# Patient Record
Sex: Female | Born: 1980 | Race: White | Hispanic: No | Marital: Married | Smoking: Never smoker
Health system: Southern US, Community
[De-identification: ages and names within clinical notes are randomized; demographics above are authoritative.]

## PROBLEM LIST (undated history)

## (undated) DIAGNOSIS — M25569 Pain in unspecified knee: Secondary | ICD-10-CM

## (undated) DIAGNOSIS — M549 Dorsalgia, unspecified: Secondary | ICD-10-CM

## (undated) DIAGNOSIS — F32A Depression, unspecified: Secondary | ICD-10-CM

## (undated) DIAGNOSIS — K5900 Constipation, unspecified: Secondary | ICD-10-CM

## (undated) DIAGNOSIS — N809 Endometriosis, unspecified: Secondary | ICD-10-CM

## (undated) DIAGNOSIS — O24419 Gestational diabetes mellitus in pregnancy, unspecified control: Secondary | ICD-10-CM

## (undated) DIAGNOSIS — E559 Vitamin D deficiency, unspecified: Secondary | ICD-10-CM

## (undated) DIAGNOSIS — K219 Gastro-esophageal reflux disease without esophagitis: Secondary | ICD-10-CM

## (undated) DIAGNOSIS — R5383 Other fatigue: Secondary | ICD-10-CM

## (undated) DIAGNOSIS — J302 Other seasonal allergic rhinitis: Secondary | ICD-10-CM

## (undated) DIAGNOSIS — R600 Localized edema: Secondary | ICD-10-CM

## (undated) DIAGNOSIS — E538 Deficiency of other specified B group vitamins: Secondary | ICD-10-CM

## (undated) DIAGNOSIS — F419 Anxiety disorder, unspecified: Secondary | ICD-10-CM

## (undated) DIAGNOSIS — M2550 Pain in unspecified joint: Secondary | ICD-10-CM

## (undated) HISTORY — DX: Depression, unspecified: F32.A

## (undated) HISTORY — DX: Vitamin D deficiency, unspecified: E55.9

## (undated) HISTORY — DX: Endometriosis, unspecified: N80.9

## (undated) HISTORY — DX: Dorsalgia, unspecified: M54.9

## (undated) HISTORY — PX: LAPAROTOMY: SHX154

## (undated) HISTORY — PX: KNEE SURGERY: SHX244

## (undated) HISTORY — DX: Pain in unspecified joint: M25.50

## (undated) HISTORY — DX: Anxiety disorder, unspecified: F41.9

## (undated) HISTORY — DX: Pain in unspecified knee: M25.569

## (undated) HISTORY — DX: Localized edema: R60.0

## (undated) HISTORY — DX: Other fatigue: R53.83

## (undated) HISTORY — DX: Gastro-esophageal reflux disease without esophagitis: K21.9

## (undated) HISTORY — DX: Deficiency of other specified B group vitamins: E53.8

## (undated) HISTORY — PX: WISDOM TOOTH EXTRACTION: SHX21

## (undated) HISTORY — DX: Constipation, unspecified: K59.00

## (undated) HISTORY — DX: Gestational diabetes mellitus in pregnancy, unspecified control: O24.419

---

## 1999-05-08 ENCOUNTER — Encounter: Payer: Self-pay | Admitting: Emergency Medicine

## 1999-05-08 ENCOUNTER — Emergency Department (HOSPITAL_COMMUNITY): Admission: EM | Admit: 1999-05-08 | Discharge: 1999-05-08 | Payer: Self-pay | Admitting: Emergency Medicine

## 2003-10-06 ENCOUNTER — Encounter: Admission: RE | Admit: 2003-10-06 | Discharge: 2003-10-06 | Payer: Self-pay | Admitting: Emergency Medicine

## 2003-11-30 ENCOUNTER — Other Ambulatory Visit: Admission: RE | Admit: 2003-11-30 | Discharge: 2003-11-30 | Payer: Self-pay | Admitting: Obstetrics and Gynecology

## 2004-11-05 ENCOUNTER — Encounter: Admission: RE | Admit: 2004-11-05 | Discharge: 2004-11-05 | Payer: Self-pay | Admitting: Obstetrics and Gynecology

## 2004-11-06 ENCOUNTER — Ambulatory Visit (HOSPITAL_COMMUNITY): Admission: RE | Admit: 2004-11-06 | Discharge: 2004-11-06 | Payer: Self-pay | Admitting: Gastroenterology

## 2004-11-06 ENCOUNTER — Encounter (INDEPENDENT_AMBULATORY_CARE_PROVIDER_SITE_OTHER): Payer: Self-pay | Admitting: Specialist

## 2006-08-07 ENCOUNTER — Encounter (INDEPENDENT_AMBULATORY_CARE_PROVIDER_SITE_OTHER): Payer: Self-pay | Admitting: Specialist

## 2006-08-07 ENCOUNTER — Ambulatory Visit (HOSPITAL_COMMUNITY): Admission: RE | Admit: 2006-08-07 | Discharge: 2006-08-07 | Payer: Self-pay | Admitting: Obstetrics and Gynecology

## 2008-07-22 ENCOUNTER — Inpatient Hospital Stay (HOSPITAL_COMMUNITY): Admission: AD | Admit: 2008-07-22 | Discharge: 2008-07-25 | Payer: Self-pay | Admitting: Obstetrics and Gynecology

## 2008-07-22 ENCOUNTER — Encounter (INDEPENDENT_AMBULATORY_CARE_PROVIDER_SITE_OTHER): Payer: Self-pay | Admitting: Obstetrics and Gynecology

## 2009-03-25 ENCOUNTER — Inpatient Hospital Stay (HOSPITAL_COMMUNITY): Admission: AD | Admit: 2009-03-25 | Discharge: 2009-03-27 | Payer: Self-pay | Admitting: Obstetrics and Gynecology

## 2010-08-22 ENCOUNTER — Emergency Department (HOSPITAL_COMMUNITY)
Admission: EM | Admit: 2010-08-22 | Discharge: 2010-08-22 | Payer: Self-pay | Source: Home / Self Care | Admitting: Family Medicine

## 2010-11-30 ENCOUNTER — Encounter: Payer: Self-pay | Admitting: Family Medicine

## 2011-02-18 LAB — COMPREHENSIVE METABOLIC PANEL
AST: 28 U/L (ref 0–37)
Albumin: 2.5 g/dL — ABNORMAL LOW (ref 3.5–5.2)
Alkaline Phosphatase: 125 U/L — ABNORMAL HIGH (ref 39–117)
Chloride: 108 mEq/L (ref 96–112)
GFR calc Af Amer: >60 mL/min (ref >60)
Potassium: 3.7 mEq/L (ref 3.5–5.1)
Sodium: 136 mEq/L (ref 135–145)
Total Bilirubin: 0.4 mg/dL (ref 0.3–1.2)
Total Protein: 5.9 g/dL — ABNORMAL LOW (ref 6.0–8.3)

## 2011-02-18 LAB — RPR: RPR Ser Ql: NONREACTIVE

## 2011-02-18 LAB — URINALYSIS, ROUTINE W REFLEX MICROSCOPIC
Bilirubin Urine: NEGATIVE
Nitrite: NEGATIVE
Specific Gravity, Urine: >1.03 — ABNORMAL HIGH (ref 1.005–1.030)
Urobilinogen, UA: 0.2 mg/dL (ref 0.0–1.0)
pH: 6 (ref 5.0–8.0)

## 2011-02-18 LAB — URINE MICROSCOPIC-ADD ON

## 2011-02-18 LAB — CBC
MCHC: 35.1 g/dL (ref 30.0–36.0)
MCV: 97.1 fL (ref 78.0–100.0)
Platelets: 167 10*3/uL (ref 150–400)
Platelets: 210 10*3/uL (ref 150–400)
RBC: 3.37 MIL/uL — ABNORMAL LOW (ref 3.87–5.11)
RDW: 12.7 % (ref 11.5–15.5)
WBC: 9.4 10*3/uL (ref 4.0–10.5)

## 2011-03-25 NOTE — Discharge Summary (Signed)
Karen Norman, UMEDA NO.:  [id number]   MEDICAL RECORD NO.:  [id number]          PATIENT TYPE:  INP   LOCATION:  9304                          FACILITY:  [facility]   PHYSICIAN:  Malachi Pro. Ambrose Mantle, M.D. DATE OF BIRTH:  September 05, 1981   DATE OF ADMISSION:  07/22/2008  DATE OF DISCHARGE:  07/25/2008                               DISCHARGE SUMMARY   A 30 year old white female at approximately 5 plus weeks' gestation by  last period of June 14, 2008, with a positive pregnancy test 2 weeks  prior to admission came to the hospital with sudden onset of abdominal  pain.  Pain was sharp.  No cramping.  No vaginal bleeding.  The patient  had had an upper respiratory infection with low-grade fever.  She had  been placed on amoxicillin prior to admission.  She had no nausea and  vomiting.  Bowel movements were normal.  She had been started on the  amoxicillin in the Urgent Care.   PAST MEDICAL HISTORY:  Negative.   OB HISTORY:  Negative.   GYN HISTORY:  No history of abnormal Paps.  Two years ago, the patient  had a diagnostic laparoscopy for endometriosis.  She had also had a knee  arthroscopy.   ALLERGIES:  She had no allergies.   MEDICATIONS:  Amoxicillin and prenatal vitamins.   PHYSICAL EXAMINATION:  VITAL SIGNS:  Temperature had been 99 at home.  Blood pressure was 120/70.  HEART/LUNGS:  Normal.  ABDOMEN:  Soft and tender diffusely with mild rebound.  PELVIC:  Deferred secondary to discomfort.   Ultrasound showed an intrauterine pregnancy with the yolk sac.  Small  fetal pole, too small for heart motion.  There was a 6 x 10 cm complex  cystic mass in the left adnexa.  Dr. Senaida Ores admitted the patient for  pain management and serial blood counts, operative management if no  resolution of the pain or drop in the hematocrit.  Later on the day, she  had more left lower quadrant pain.  She had not been out of bed.  She  had taken Dilaudid.  Dr. Senaida Ores discussed  options with the patient  and she decided to proceed to the OR because of the significant pain.  The patient underwent a minilaparotomy by Dr. Senaida Ores because of the  severe abdominal pain, left pelvic mass, and suspected hemoperitoneum,  and she found a ruptured left ovary with approximately 500 mL  hemoperitoneum with continued bleeding.  She underwent a left salpingo-  oophorectomy.  Postoperatively, she did very well.  She did have a  temperature elevation to 102.4 degrees.  There was no obvious site of  the fever.  She defervesced on Unasyn and has remained afebrile for 30  hours prior to discharge.   LABORATORY DATA:  Initial hemoglobin 13.2, hematocrit 39.4, white count  62,100, and platelet count 202,000.  Followup hemoglobins on November 8,  10.4 and 9.4.  Metabolic profile showed a potassium of 3.4, glucose of  128, AST of 38, and ALT of 29.  HCG quantitative was 15, 900.  Ultrasound showed intrauterine gestational sac containing  an embryo.  Crown-rump length was 2 mm.  Cardiac activity could not be visualized.  There was a 10-cm complex mass in the left adnexa.  Differential  diagnosis was inflammatory processes such as tubo ovarian abscess,  cystic neoplasm of the ovary or heterotopic pregnancy.  Path report is  pending.   FINAL DIAGNOSES:  1. Ruptured left ovarian cyst with hemoperitoneum.  2. Intrauterine pregnancy, 5 plus weeks.   OPERATION:  1. Minilaparotomy.  2. Left salpingo-oophorectomy.   FINAL CONDITION:  Improved.   INSTRUCTIONS:  No vaginal entrance, no heavy lifting or strenuous  activity.  Call with any fever above 100.4 degrees.  Call with any  problems.  The patient is discharged on Prometrium or progesterone  vaginally and Percocet for pain.  She is to continue her amoxicillin and  return to the office in 2 days to have her staples removed.      Malachi Pro. Ambrose Mantle, M.D.  Electronically Signed     TFH/MEDQ  D:  07/25/2008  T:  07/25/2008   Job:  [id number]

## 2011-03-25 NOTE — Discharge Summary (Signed)
NAMEMAI, LONGNECKER NO.:  [id number]   MEDICAL RECORD NO.:  [id number]          PATIENT TYPE:  INP   LOCATION:  9131                          [facility]   PHYSICIAN:  Malachi Pro. Ambrose Mantle, M.D. DATE OF BIRTH:  07/23/1981   DATE OF ADMISSION:  03/25/2009  DATE OF DISCHARGE:  03/27/2009                               DISCHARGE SUMMARY   A 30 year old white married female para 0, gravida 1, EDC Mar 20, 2009,  admitted in early labor.  The patient felt a pop lying in bed, did not  see any flow of fluid, but some little bit of bleeding.  Blood group and  type O+, negative antibody, nonreactive serology, rubella immune,  hepatitis B surface antigen negative, HIV negative, GC and chlamydia  negative, first trimester screen negative, 1-hour Glucola 126, group B  strep negative.  Ultrasound on July 31, 2008, crown-rump length  6.35 mm, 6 weeks 3 days, [facility] Mar 23, 2009.  The patient had a left  salpingo-oophorectomy for hemorrhagic corpus luteum cyst with  hemoperitoneum on July 22, 2008.  Ultrasound on August 05, 2008,  crown-rump length was 1.6 cm, [facility] Mar 19, 2009.  Ultrasound on September 07, 2008, crown-rump length 4.8 cm, 6.48 cm, 12 weeks 6 days, [facility] Mar 16, 2009.  Ultrasound on October 19, 2008, average gestational age, 13  weeks 0 days, [facility] Mar 15, 2009.  The patient received Dilaudid until  September 11, 2008.  Prenatal care otherwise uncomplicated.   PAST MEDICAL HISTORY:   ALLERGIES:  No known allergies.   OPERATIONS:  The patient had a diagnostic laparoscopy for  endometriosis.  She had a left salpingo-oophorectomy.  She had had  arthroscopic surgery and wisdom teeth extracted   ILLNESSES:  Endometriosis.   SOCIAL HISTORY:  Alcohol, tobacco, and drugs none.   FAMILY HISTORY:  Father with an MI and high blood pressure.  Paternal  grandmother and maternal grandmother with breast cancer.  Father with  bipolar.   PHYSICAL EXAMINATION:  VITAL  SIGNS:  On admission, the patient's blood  pressures were elevated.  Serial blood pressures revealed blood  pressures to be between 137/91 and 160/96.  HEART/LUNGS:  Normal.  ABDOMEN:  Soft.  PELVIC:  Term size fundus.  Cervix was 3 cm, 90%, vertex at a -2.   LABORATORY FINDINGS:  PIH labs were done showed no evidence of  preeclampsia.  Ultrasound showed no evidence of abruption.  The patient  received an epidural.  By 1:35 p.m., the cervix was 10 cm, vertex at a 0  station.  Her contractions were every 2-3 minutes.  There were some  variable decelerations with contractions.  There was more vaginal  bleeding than would expect.  By 2:43 p.m., the cervix was 10 cm, vertex  ROA.  There was a small amount of caput visible with contractions.  The  patient pushed over 2 hours and asked for assistance.  A Kiwi self-  controlled vacuum was placed with approximately 6 cm of caput visible  with pushing and was 1 contraction and no pop offs, a living female  infant,  7 pounds 6 ounces was delivered, ROA over a second-degree right  of midline laceration with extension to the right sulcus.  A left labial  laceration with extension into the vagina was also present.  Placenta  was intact.  The uterus was normal.  Rectal was negative.  Lacerations  were repaired with 2-0 and 3-0 Vicryl with 1% Xylocaine local block.  Blood loss was estimated at 500 mL.  Apgars of the infant were 9 at 1  and 9 at 5 minutes.  Postpartum, the patient did well and was discharged  on the second postpartum day.  RPR was nonreactive.   INITIAL LABORATORY WORK:  SGOT and PT were 28 and 17 respectively.  Bilirubin was 0.4, creatinine was 0.68.  LDH 128, uric acid 6.4.  Cath  urine showed no protein.  Initial hemoglobin 13.7, hematocrit 39.2,  white count 94,100, platelet count 210,000.  Followup hemoglobin 11.5,  hematocrit 32.7, white count 10,400, and platelet counts 167,000.   FINAL DIAGNOSES:  Intrauterine pregnancy at 40+  weeks, pregnancy-induced  high blood pressure.   OPERATION:  Vacuum-assisted vaginal delivery ROA, repair of right of  midline second-degree laceration into the right sulcus and left labial  laceration.   FINAL CONDITION:  Improved.   INSTRUCTIONS:  Our regular discharge instruction booklet.  Motrin 600 mg  30 tablets 1 every 6 hours as needed for pain.  Percocet 5/325, 30  tablets 1 every 4-6 hours as needed for pain.  The patient is advised to  return to the office in 6 weeks for followup examination.      Malachi Pro. Ambrose Mantle, M.D.  Electronically Signed     TFH/MEDQ  D:  03/27/2009  T:  03/27/2009  Job:  [id number]

## 2011-03-25 NOTE — Op Note (Signed)
Karen Norman, KLUTTS NO.:  [id number]   MEDICAL RECORD NO.:  [id number]          PATIENT TYPE:  INP   LOCATION:  9304                          [facility]   PHYSICIAN:  Huel Cote, M.D. DATE OF BIRTH:  [birth year]   DATE OF PROCEDURE:  07/22/2008  DATE OF DISCHARGE:  07/25/2008                               OPERATIVE REPORT   PREOPERATIVE DIAGNOSES:  1. Severe abdominal pain with acute abdomen.  2. Left pelvic mass.  3. Suspected hemoperitoneum.   POSTOPERATIVE DIAGNOSIS:  Ruptured left ovary with 500 plus mL's of  hemoperitoneum and continued bleeding.   PROCEDURE:  Minilaparotomy and left salpingo-oophorectomy   SURGEON:  Huel Cote, MD   ANESTHESIA:  Spinal.   FINDINGS:  There is a left ovary which was greater than 8 cm in diameter  with a rupture in the capsule, which spanned the entire length of the  ovary.  There was approximately 500 mL of hemoperitoneum with additional  clot inside the ovary itself.  Uterus and right ovary and tube were  completely normal.   SPECIMENS:  Left salpingo-oophorectomy, which was sent to Pathology and  this confirmed a benign ruptured ovarian cyst.   ESTIMATED BLOOD LOSS:  600 mL total.   IV FLUIDS:  2000 mL clear urine.   URINE OUTPUT:  300 mL clear urine.   PROCEDURE IN DETAIL:  After a careful counseling, the patient was taken  to the operating room and spinal anesthesia was obtained without  difficulty.  She was then prepped and draped in the normal sterile  fashion in the dorsal supine position.  A small Pfannenstiel skin  incision was then made over the symphysis pubis and carried through the  underlying layer of fascia by sharp dissection and Bovie cautery.  The  fascia was then opened in the midline and extended laterally with Mayo  scissors.  The inferior aspect was grasped, elevated, and dissected off  the rectus muscles.  Superior aspect was likewise dissected off the  rectus muscles.   These were separated in the midline, and the peritoneal  cavity entered bluntly.  Hemoperitoneum was immediately encountered and  suctioned, and thought to be approximately 500 mL.  The left ovary was  identified and lifted up through the incision and was found to be  grossly enlarged with a split down the entire length of the ovarian  capsule with clot in the inner part of the ovary, as well as continued  bleeding from the capsule.  At this point given the gross distortion of  the ovary and continued bleeding, it was felt that the safest procedure  for the ongoing intrauterine pregnancy was a quick removal of the left  tube and ovary versus a prolonged surgery with trying to stop the  bleeding on the ovary.  Therefore, the patient had a left salpingo-  oophorectomy performed and the specimen was handed off to Pathology.  The remaining ovary and tube looked normal.  The uterus itself appears  slightly enlarged, but normal and therefore all hemoperitoneum and  bleeding were removed and appeared hemostatic.  The peritoneum was then  closed with 0 Vicryl in a running fashion, the fascia was closed with 0  Vicryl in a running fashion, and the skin was closed with staples.  The  patient will be placed on Prometrium and Delalutin IM to support the  pregnancy with the removal of a corpus luteum cyst and a careful  discussion was had with her and her family about the possible impact to  the pregnancy.  They understand that there is really no alternative  given the significant bleeding intra-abdominally and that we will do  everything possible to continue to support the ongoing intrauterine  pregnancy      Huel Cote, M.D.  Electronically Signed     KR/MEDQ  D:  08/14/2008  T:  08/15/2008  Job:  [id number]

## 2011-03-28 NOTE — Op Note (Signed)
Karen Norman, Karen Norman                 ACCOUNT NO.:  [id number]   MEDICAL RECORD NO.:  [id number]          PATIENT TYPE:  AMB   LOCATION:  ENDO                         FACILITY:  [facility]   PHYSICIAN:  Petra Kuba, M.D.    DATE OF BIRTH:  [birth year]   DATE OF PROCEDURE:  11/06/2004  DATE OF DISCHARGE:                                 OPERATIVE REPORT   PROCEDURE PERFORMED:  Esophagogastroduodenoscopy with biopsy.   ENDOSCOPIST:  Petra Kuba, M.D.   INDICATIONS FOR PROCEDURE:  Upper tract symptoms, reflux, iron deficiency.  Consent was signed after the risks, benefits, methods and options were  thoroughly discussed in the office.   MEDICINES USED:  Demerol 50 mg, Versed 7.5 mg.   DESCRIPTION OF PROCEDURE:  The video endoscope was inserted by direct  vision.  No obvious hiatal hernia was seen.  Possibly a little loosening of  the gastroesophageal junction was noted.  Scope passed into the stomach and  advanced through the normal antrum normal pylorus into a normal duodenal  bulb and around the C-loop to a normal second portion of the duodenum.  A  few distal duodenal biopsies were obtained to rule out malabsorption.  The  scope was withdrawn back to the stomach and a good look at the bulb did not  reveal any additional findings.  The stomach was evaluated on retroflex and  straight visualization with a good look at the cardia, fundus, angularis,  lesser and greater curve.  No findings were seen.  Air was suctioned, scope  was slowly withdrawn.  Again, a good look at the esophagus was normal.  Scope was removed.  The patient tolerated the procedure well.  There were no  obvious immediate complication.   ENDOSCOPIC DIAGNOSIS:  Essentially normal esophagogastroduodenoscopy status  post duodenal biopsies to rule out malabsorption.   PLAN:  Await CBC, iron level and guaiacs to decide any other work-up and  plan.  Consider an upper abdominal ultrasound next to rule out gallstones if  symptoms continue.  Otherwise, will try different pump inhibitors secondary  to what's approved by her insurance and follow her up in the office in a few  months.  Will talk on the phone in a week about the biopsies.       MEM/MEDQ  D:  11/06/2004  T:  11/06/2004  Job:  [id number]   cc:   Onalee Hua L. Reed Breech, M.D.  [facility]  [address]  [place], [place] [address]  Fax: [phone]

## 2011-03-28 NOTE — Op Note (Signed)
NAME:  Karen Norman, GAMMON NO.:  [id number]   MEDICAL RECORD NO.:  [id number]          PATIENT TYPE:  AMB   LOCATION:  SDC                           [facility]   PHYSICIAN:  Richardean Sale, M.D.   DATE OF BIRTH:  1981/06/11   DATE OF PROCEDURE:  08/07/2006  DATE OF DISCHARGE:                                 OPERATIVE REPORT   PREOPERATIVE DIAGNOSES:  1. Pelvic pain.  2. Dyspareunia.   POSTOPERATIVE DIAGNOSES:  1. Pelvic pain.  2. Dyspareunia.  3. Endometriosis.   PROCEDURE:  Laparoscopy with fulguration of endometriosis.   SURGEON:  Richardean Sale, M.D.   ASSISTANT:  None.   COMPLICATIONS:  None.   ANESTHESIA:  General.   ESTIMATED BLOOD LOSS:  Minimal.   OPERATIVE FINDINGS:  Normal-appearing left Fallopian tube and ovary, normal-  appearing right ovary, two small (less than 0.5 cm) paratubal cysts on the  right with a bluish hue consistent with endometriosis, normal-appearing  uterus, normal anterior cul-de-sac and posterior cul-de-sac, one  endometriosis implant near the left uterosacral ligament, and a peritoneal  window beneath the left uterosacral ligament.   SPECIMENS:  Right tubal cyst to pathology.   INDICATIONS:  This is a 30 year old, gravida 3, white female who has a  history progressively worsening pelvic pain, dysmenorrhea, and dyspareunia  despite oral contraceptive pills.  The patient presents today for diagnostic  laparoscopy to evaluate for possible endometriosis.  The patient has no  symptoms suggestive of any gastrointestinal or urinary etiology as a source  of her pain.  She has been counseled on the risks, benefits, and  alternatives to the procedure including but not limited to hemorrhage  regarding transfusion, infection, injury to the bowel, the bladder, the  ureters, or other organs which could require additional surgery at the time  of this procedure or in the future, the possibility of anesthetic-related  complications,  venous thromboembolism, as well as the possibility of normal  anatomical findings at the time of the procedure.  The patient is also aware  that this procedure may not improve her pain.  The patient voices  understanding of all of the above and desires to proceed.  Informed consent  is then obtained and all questions answered before going to the OR.   DESCRIPTION OF PROCEDURE:  The patient was taken to the operating room where  she was given a general anesthetic.  She was then prepped and draped in the  usual sterile fashion and placed in the dorsal lithotomy position.  A red  rubber catheter was then used to drain the bladder.  Bimanual exam confirmed  the presence of a small midline mobile uterus with no obvious adnexal masses  and no posterior cul-de-sac nodularity.  A speculum was then placed in the  vagina, and the cervix was then grasped with a single-toothed tenaculum.  The acorn cannula was then introduced and the speculum was removed.  Attention was then turned to the patient's abdomen where 0.5% plain Marcaine  were injected into the infraumbilical area and a 10 mm skin incision was  made with the scalpel.  This was carried down sharply to the fascia.  The  fascia was then grasped with two Kocher clamps, elevated, and entered  sharply with the Mayo scissors.  The peritoneum was then identified, grasped  with a Kelly clamp, and entered sharply.  The fascial incision was secured  with a pursestring Vicryl suture and the Hassan trocar and sleeve were then  introduced.  Intraabdominal placement was confirmed with placement of the  laparoscope.  CO2 gas was then allowed to insufflate.  Inspection of the  pelvis revealed the findings noted above as well as a normal-appearing  appendix, normal liver and gallbladder.  At this point, a second incision  was then made in the suprapubic area.  This was a 5 mm skin incision, again  first by using Marcaine 3 cubic centimeters prior to making  the incision.  Through this incision, a 5 mm port was then introduced under direct  visualization.  A third port was placed in the right lower quadrant in a  similar fashion in an area that was well away from the inferior epigastric  vessels.  Using the Klepenger bipolar cautery, the endometrial implant along  the left uterosacral ligament were then cauterized using the Endoshears.  The right paratubal cyst was cauterized at its base and transected; this was  sent to pathology.  The second cyst on the right ovary was smaller.  In the  process of trying to remove this with the Endoshears, the cyst did rupture  with serous chocolate-appearing fluid inside.  The edges of the cyst were  then grasped and cauterized and the Fallopian tube was hemostatic.  These  cysts did not appear to affect the lumen or the fimbria of the Fallopian  tube.  At this point, the procedure was terminated.  Ten cubic centimeters  of 0.5% Marcaine were introduced into the abdomen with a syringe; this was  poured over the right Fallopian tube for additional postoperative pain  relief.  All instruments were then removed from the patient's abdomen under  direct visualization.  CO2 gas was allowed to escape.  The laparoscope and  Hassan trocar were then removed.  The fascial incision suture was tied with  adequate closure of the fascia.  The skin edges were then closed with 4-0  Vicryl in a subcuticular fashion.  The acorn cannula and single-toothed  tenaculum were removed from the vagina.  The patient tolerated the procedure  very well.  All sponge, lap, needle, and instrument counts were correct x 2.  She was taken to the recovery room awakened in stable condition, and there  were no complications.      Richardean Sale, M.D.  Electronically Signed     JW/MEDQ  D:  08/07/2006  T:  08/09/2006  Job:  [id number]

## 2011-03-31 ENCOUNTER — Inpatient Hospital Stay (HOSPITAL_COMMUNITY)
Admission: AD | Admit: 2011-03-31 | Discharge: 2011-04-03 | DRG: 372 | Disposition: A | Discharge status: Home / Self Care | Payer: BC Managed Care – PPO | Source: Ambulatory Visit | Attending: Obstetrics and Gynecology | Admitting: Obstetrics and Gynecology

## 2011-03-31 DIAGNOSIS — O2690 Pregnancy related conditions, unspecified, unspecified trimester: Secondary | ICD-10-CM

## 2011-04-01 ENCOUNTER — Inpatient Hospital Stay (HOSPITAL_COMMUNITY): Payer: BC Managed Care – PPO

## 2011-04-01 ENCOUNTER — Encounter (HOSPITAL_COMMUNITY): Payer: Self-pay | Admitting: Radiology

## 2011-04-01 ENCOUNTER — Other Ambulatory Visit: Payer: Self-pay | Admitting: Obstetrics and Gynecology

## 2011-04-01 LAB — MAGNESIUM: Magnesium: 8.8 mg/dL (ref 1.5–2.5)

## 2011-04-02 LAB — CBC
HCT: 34.6 % — ABNORMAL LOW (ref 36.0–46.0)
MCHC: 32.7 g/dL (ref 30.0–36.0)
Platelets: 240 10*3/uL (ref 150–400)
RDW: 13.7 % (ref 11.5–15.5)
WBC: 21.7 10*3/uL — ABNORMAL HIGH (ref 4.0–10.5)

## 2011-04-03 ENCOUNTER — Encounter (HOSPITAL_COMMUNITY)
Admission: RE | Admit: 2011-04-03 | Discharge: 2011-04-03 | Disposition: A | Discharge status: Home / Self Care | Payer: BC Managed Care – PPO | Source: Ambulatory Visit | Attending: Obstetrics and Gynecology | Admitting: Obstetrics and Gynecology

## 2011-04-03 DIAGNOSIS — O923 Agalactia: Secondary | ICD-10-CM | POA: Insufficient documentation

## 2011-04-07 NOTE — Discharge Summary (Signed)
NAME:  Karen Norman, FOLZ NO.:  [id number]  MEDICAL RECORD NO.:  [id number]           PATIENT TYPE:  I  LOCATION:  9315                          [facility]  [facility]  PHYSICIAN:  Zenaida Niece, M.D.DATE OF BIRTH:  08-20-1981  DATE OF ADMISSION:  03/31/2011 DATE OF DISCHARGE:  04/03/2011                              DISCHARGE SUMMARY   ADMISSION DIAGNOSIS:  Intrauterine pregnancy at 23+ weeks with advanced cervical dilation.  DISCHARGE DIAGNOSES:  Intrauterine pregnancy at 23+ weeks with advanced cervical dilation and preterm labor and delivery.  PROCEDURES:  On Apr 01, 2011, she had a spontaneous vaginal delivery.  HISTORY AND PHYSICAL:  This is a 30 year old gravida 2, para 1-0-0-1 with an EGA of 23 plus weeks who was admitted with cramping and vaginal bleeding.  On evaluation in maternity admissions, there was noted to be moderate bleeding and bag of water was palpable in the vagina.  Prenatal care had been uncomplicated up to this point.  Prenatal labs are O+ with negative antibody screen, rubella immune, first trimester screen normal, and MSAFP is normal.  PAST OB HISTORY:  One vacuum-assisted delivery at 40 weeks, 7 pounds 6 ounces.  PAST MEDICAL HISTORY:  Anemia, asthma, migraines, and peptic ulcer disease.  PAST SURGICAL HISTORY:  Knee surgery, laparoscopy, and left salpingo- oophorectomy.  PHYSICAL EXAMINATION:  VITAL SIGNS:  She was afebrile with stable vital signs. ABDOMEN:  Gravid and nontender.  Fetal heart tones in the 150s.  On ultrasound, fetal presentation was variable, but mostly vertex.  Cervix was 1-2 cm dilated, feet and hands intermittently protruding through hour glassing membranes.  HOSPITAL COURSE:  The patient was admitted and put on magnesium for both tocolysis and for cerebral palsy prophylaxis.  She was given steroids for fetal lung maturity and placed in Trendelenburg and had a Foley catheter placed as well.  Contractions  initially continued.  She felt pressure on the afternoon of Apr 01, 2011, and on cervical exam the bag of water was filling the upper vagina making it difficult to assess the cervix.  There was no presenting part palpable.  She did get symptomatic from an elevated magnesium level and magnesium was stopped for a little while and then restarted at 2 grams an hour.  Early in the evening on the 22nd contractions quiesced and she was resting comfortably. However, few hours later contractions increased on the magnesium, and she was given a dose of p.o. Indocin, however, membranes ruptured.  She quickly progressed and had a vaginal delivery of a viable female infant. I did not have Apgars or weight.  The nurse midwife delivered the baby as it happened rapidly.  NICU team was at delivery.  I arrived shortly and delivered the placenta.  It was sent for pathology.  Perineum was intact.  Intermittent bleeding was controlled with massage and IV Pitocin.  Estimated blood loss was 500 mL.  Baby did get intubated and was taken to the NICU.  The patient had no significant complications after delivery and remained afebrile.  Placental pathology is pending. On postpartum day #2, the patient is felt to be stable enough for discharge  home.  DISCHARGE INSTRUCTIONS:  Regular diet, pelvic rest, and follow up in 6 weeks.  MEDICATIONS:  Over-the-counter ibuprofen, and she was given our discharge pamphlet.     Zenaida Niece, M.D.     TDM/MEDQ  D:  04/03/2011  T:  04/03/2011  Job:  [id number]  Electronically Signed by Lavina Hamman M.D. on 04/07/2011 10:19:29 AM

## 2011-05-04 ENCOUNTER — Encounter (HOSPITAL_COMMUNITY)
Admission: RE | Admit: 2011-05-04 | Discharge: 2011-05-04 | Disposition: A | Discharge status: Home / Self Care | Payer: BC Managed Care – PPO | Source: Ambulatory Visit | Attending: Obstetrics and Gynecology | Admitting: Obstetrics and Gynecology

## 2011-05-04 DIAGNOSIS — O923 Agalactia: Secondary | ICD-10-CM | POA: Insufficient documentation

## 2011-06-04 ENCOUNTER — Encounter (HOSPITAL_COMMUNITY)
Admission: RE | Admit: 2011-06-04 | Discharge: 2011-06-04 | Disposition: A | Discharge status: Home / Self Care | Payer: BC Managed Care – PPO | Source: Ambulatory Visit | Attending: Obstetrics and Gynecology | Admitting: Obstetrics and Gynecology

## 2011-06-04 DIAGNOSIS — O923 Agalactia: Secondary | ICD-10-CM | POA: Insufficient documentation

## 2011-07-05 ENCOUNTER — Encounter (HOSPITAL_COMMUNITY)
Admission: RE | Admit: 2011-07-05 | Discharge: 2011-07-05 | Disposition: A | Discharge status: Home / Self Care | Payer: BC Managed Care – PPO | Source: Ambulatory Visit | Attending: Obstetrics and Gynecology | Admitting: Obstetrics and Gynecology

## 2011-07-05 DIAGNOSIS — O923 Agalactia: Secondary | ICD-10-CM | POA: Insufficient documentation

## 2011-07-27 ENCOUNTER — Inpatient Hospital Stay (HOSPITAL_COMMUNITY): Admission: AD | Admit: 2011-07-27 | Payer: Self-pay | Source: Ambulatory Visit | Admitting: Obstetrics and Gynecology

## 2011-08-05 ENCOUNTER — Encounter (HOSPITAL_COMMUNITY)
Admission: RE | Admit: 2011-08-05 | Discharge: 2011-08-05 | Disposition: A | Discharge status: Home / Self Care | Payer: BC Managed Care – PPO | Source: Ambulatory Visit | Attending: Obstetrics and Gynecology | Admitting: Obstetrics and Gynecology

## 2011-08-05 DIAGNOSIS — O923 Agalactia: Secondary | ICD-10-CM | POA: Insufficient documentation

## 2011-08-13 LAB — COMPREHENSIVE METABOLIC PANEL
ALT: 29
AST: 38 — ABNORMAL HIGH
Albumin: 3.5
Alkaline Phosphatase: 49
Calcium: 8.9
GFR calc Af Amer: >60
Glucose, Bld: 128 — ABNORMAL HIGH
Potassium: 3.4 — ABNORMAL LOW
Sodium: 138
Total Protein: 6.6

## 2011-08-13 LAB — CBC
HCT: 27.4 — ABNORMAL LOW
HCT: 29.5 — ABNORMAL LOW
HCT: 34.8 — ABNORMAL LOW
Hemoglobin: 9.4 — ABNORMAL LOW
MCHC: 34
MCHC: 34.2
MCV: 94.7
MCV: 95
Platelets: 173
Platelets: 199
Platelets: 202
RBC: 2.9 — ABNORMAL LOW
RDW: 12.9
RDW: 13
RDW: 13.2
WBC: 6.2
WBC: 7.6

## 2011-08-13 LAB — URINE CULTURE

## 2011-08-13 LAB — HCG, QUANTITATIVE, PREGNANCY: hCG, Beta Chain, Quant, S: 15900 — ABNORMAL HIGH

## 2011-08-13 LAB — SAMPLE TO BLOOD BANK

## 2011-09-05 ENCOUNTER — Encounter (HOSPITAL_COMMUNITY)
Admission: RE | Admit: 2011-09-05 | Discharge: 2011-09-05 | Disposition: A | Discharge status: Home / Self Care | Payer: BC Managed Care – PPO | Source: Ambulatory Visit | Attending: Obstetrics and Gynecology | Admitting: Obstetrics and Gynecology

## 2011-09-05 DIAGNOSIS — O923 Agalactia: Secondary | ICD-10-CM | POA: Insufficient documentation

## 2011-10-06 ENCOUNTER — Encounter (HOSPITAL_COMMUNITY)
Admission: RE | Admit: 2011-10-06 | Discharge: 2011-10-06 | Disposition: A | Discharge status: Home / Self Care | Payer: BC Managed Care – PPO | Source: Ambulatory Visit | Attending: Obstetrics and Gynecology | Admitting: Obstetrics and Gynecology

## 2011-10-06 DIAGNOSIS — O923 Agalactia: Secondary | ICD-10-CM | POA: Insufficient documentation

## 2011-11-06 ENCOUNTER — Encounter (HOSPITAL_COMMUNITY)
Admission: RE | Admit: 2011-11-06 | Discharge: 2011-11-06 | Disposition: A | Discharge status: Home / Self Care | Payer: PRIVATE HEALTH INSURANCE | Source: Ambulatory Visit | Attending: Obstetrics and Gynecology | Admitting: Obstetrics and Gynecology

## 2011-11-06 DIAGNOSIS — O923 Agalactia: Secondary | ICD-10-CM | POA: Insufficient documentation

## 2011-12-07 ENCOUNTER — Encounter (HOSPITAL_COMMUNITY)
Admission: RE | Admit: 2011-12-07 | Discharge: 2011-12-07 | Disposition: A | Discharge status: Home / Self Care | Payer: PRIVATE HEALTH INSURANCE | Source: Ambulatory Visit | Attending: Obstetrics and Gynecology | Admitting: Obstetrics and Gynecology

## 2011-12-07 DIAGNOSIS — O923 Agalactia: Secondary | ICD-10-CM | POA: Insufficient documentation

## 2012-01-07 ENCOUNTER — Encounter (HOSPITAL_COMMUNITY)
Admission: RE | Admit: 2012-01-07 | Discharge: 2012-01-07 | Disposition: A | Discharge status: Home / Self Care | Payer: PRIVATE HEALTH INSURANCE | Source: Ambulatory Visit | Attending: Obstetrics and Gynecology | Admitting: Obstetrics and Gynecology

## 2012-01-07 DIAGNOSIS — O923 Agalactia: Secondary | ICD-10-CM | POA: Insufficient documentation

## 2012-09-09 ENCOUNTER — Other Ambulatory Visit: Payer: Self-pay | Admitting: Obstetrics and Gynecology

## 2012-09-09 DIAGNOSIS — N63 Unspecified lump in unspecified breast: Secondary | ICD-10-CM

## 2012-09-16 ENCOUNTER — Ambulatory Visit
Admission: RE | Admit: 2012-09-16 | Discharge: 2012-09-16 | Disposition: A | Discharge status: Home / Self Care | Payer: Commercial Managed Care - PPO | Source: Ambulatory Visit | Attending: Obstetrics and Gynecology | Admitting: Obstetrics and Gynecology

## 2012-09-16 ENCOUNTER — Other Ambulatory Visit: Payer: Self-pay | Admitting: Obstetrics and Gynecology

## 2012-09-16 DIAGNOSIS — N63 Unspecified lump in unspecified breast: Secondary | ICD-10-CM

## 2013-02-15 ENCOUNTER — Ambulatory Visit (INDEPENDENT_AMBULATORY_CARE_PROVIDER_SITE_OTHER): Payer: Commercial Managed Care - PPO | Admitting: Family Medicine

## 2013-02-15 VITALS — BP 105/73 | HR 97 | Temp 98.5°F | Resp 18 | Ht 65.0 in | Wt 165.0 lb

## 2013-02-15 DIAGNOSIS — R509 Fever, unspecified: Secondary | ICD-10-CM

## 2013-02-15 DIAGNOSIS — R109 Unspecified abdominal pain: Secondary | ICD-10-CM

## 2013-02-15 DIAGNOSIS — A084 Viral intestinal infection, unspecified: Secondary | ICD-10-CM

## 2013-02-15 DIAGNOSIS — K921 Melena: Secondary | ICD-10-CM

## 2013-02-15 LAB — COMPREHENSIVE METABOLIC PANEL
ALT: 28 U/L (ref 0–35)
AST: 28 U/L (ref 0–37)
Albumin: 4.1 g/dL (ref 3.5–5.2)
Calcium: 8.9 mg/dL (ref 8.4–10.5)
Chloride: 105 mEq/L (ref 96–112)
Potassium: 4 mEq/L (ref 3.5–5.3)
Total Protein: 7.6 g/dL (ref 6.0–8.3)

## 2013-02-15 LAB — POCT CBC
Granulocyte percent: 86.5 %G — AB (ref 37–80)
Hemoglobin: 14.4 g/dL (ref 12.2–16.2)
MID (cbc): 0.4 (ref 0–0.9)
MPV: 9.4 fL (ref 0–99.8)
POC MID %: 4 %M (ref 0–12)
Platelet Count, POC: 272 10*3/uL (ref 142–424)
RBC: 4.76 M/uL (ref 4.04–5.48)

## 2013-02-15 LAB — POCT URINALYSIS DIPSTICK
Bilirubin, UA: NEGATIVE
Blood, UA: NEGATIVE
Glucose, UA: NEGATIVE
Leukocytes, UA: NEGATIVE
Nitrite, UA: NEGATIVE
Protein, UA: 100
Spec Grav, UA: 1.025
Urobilinogen, UA: 0.2
pH, UA: 6

## 2013-02-15 LAB — POCT UA - MICROSCOPIC ONLY
Casts, Ur, LPF, POC: NEGATIVE
Crystals, Ur, HPF, POC: NEGATIVE
Mucus, UA: POSITIVE
Yeast, UA: NEGATIVE

## 2013-02-15 MED ORDER — PROMETHAZINE HCL 12.5 MG PO TABS: 12.5000 mg | ORAL_TABLET | Freq: Three times a day (TID) | ORAL | Status: DC | PRN

## 2013-02-15 MED ORDER — LOPERAMIDE HCL 2 MG PO CAPS: 2.0000 mg | ORAL_CAPSULE | Freq: Four times a day (QID) | ORAL | Status: DC | PRN

## 2013-02-15 NOTE — Patient Instructions (Signed)
Viral Gastroenteritis Viral gastroenteritis is also known as stomach flu. This condition affects the stomach and intestinal tract. It can cause sudden diarrhea and vomiting. The illness typically lasts 3 to 8 days. Most people develop an immune response that eventually gets rid of the virus. While this natural response develops, the virus can make you quite ill. CAUSES  Many different viruses can cause gastroenteritis, such as rotavirus or noroviruses. You can catch one of these viruses by consuming contaminated food or water. You may also catch a virus by sharing utensils or other personal items with an infected person or by touching a contaminated surface. SYMPTOMS  The most common symptoms are diarrhea and vomiting. These problems can cause a severe loss of body fluids (dehydration) and a body salt (electrolyte) imbalance. Other symptoms may include:  Fever.  Headache.  Fatigue.  Abdominal pain. DIAGNOSIS  Your caregiver can usually diagnose viral gastroenteritis based on your symptoms and a physical exam. A stool sample may also be taken to test for the presence of viruses or other infections. TREATMENT  This illness typically goes away on its own. Treatments are aimed at rehydration. The most serious cases of viral gastroenteritis involve vomiting so severely that you are not able to keep fluids down. In these cases, fluids must be given through an intravenous line (IV). HOME CARE INSTRUCTIONS   Drink enough fluids to keep your urine clear or pale yellow. Drink small amounts of fluids frequently and increase the amounts as tolerated.  Ask your caregiver for specific rehydration instructions.  Avoid:  Foods high in sugar.  Alcohol.  Carbonated drinks.  Tobacco.  Juice.  Caffeine drinks.  Extremely hot or cold fluids.  Fatty, greasy foods.  Too much intake of anything at one time.  Dairy products until 24 to 48 hours after diarrhea stops.  You may consume probiotics.  Probiotics are active cultures of beneficial bacteria. They may lessen the amount and number of diarrheal stools in adults. Probiotics can be found in yogurt with active cultures and in supplements.  Wash your hands well to avoid spreading the virus.  Only take over-the-counter or prescription medicines for pain, discomfort, or fever as directed by your caregiver. Do not give aspirin to children. Antidiarrheal medicines are not recommended.  Ask your caregiver if you should continue to take your regular prescribed and over-the-counter medicines.  Keep all follow-up appointments as directed by your caregiver. SEEK IMMEDIATE MEDICAL CARE IF:   You are unable to keep fluids down.  You do not urinate at least once every 6 to 8 hours.  You develop shortness of breath.  You notice blood in your stool or vomit. This may look like coffee grounds.  You have abdominal pain that increases or is concentrated in one small area (localized).  You have persistent vomiting or diarrhea.  You have a fever.  The patient is a child younger than 3 months, and he or she has a fever.  The patient is a child older than 3 months, and he or she has a fever and persistent symptoms.  The patient is a child older than 3 months, and he or she has a fever and symptoms suddenly get worse.  The patient is a baby, and he or she has no tears when crying. MAKE SURE YOU:   Understand these instructions.  Will watch your condition.  Will get help right away if you are not doing well or get worse. Document Released: 10/27/2005 Document Revised: 01/19/2012 Document Reviewed: 08/13/2011   Exit[employer] Patient Information 2013 [employer].  

## 2013-02-15 NOTE — Progress Notes (Signed)
Subjective:    Patient ID: Karen Norman, female    DOB: 1981-03-03, 32 y.o.   MRN: [id number]   Chief Complaint  Patient presents with  . Abdominal Pain  . Fever    HPI  Sxs started Sun night, felt achy and fever and upset stomach - whole family had stomach virus 2 wks ago so thought maybe it was just a flair up. Last night, she started getting markedly worse. Usually advil breaks her fever but took 3 tabs and fever kept going up - was up 101.4 but 4 hrs after advil it was still 101 so took tylenol then after an hour it was 102.6  She asked her pharmacist/mother who had her take more tabs of advil 4 tabs 6 hours later, then woke up with sweats overnight and finally her fever broke.  Having tons of black watery diarrhea - can eat but it just goes straight through her. Still a little nausea but often resolves w/ diarrhea.   Fever was back this a.m. and so she took advil again at 6 a.m. and felt fever break again before coming here.  She was at her grandfather's funeral on Sunday and did start feeling a little nauseated since then.    Urine reduced, is supposed to be starting period and spotting on Sunday. Had a child prematurely at 23 wks about 1 yr ago - was in the NICU x 6 mos and is now special needs - due to a UTI that went into her cervix.  She was incredibly ill with this but really just presented with nausea, back pain, fever - so she is a little worried that she could have a UTI again though no dysuria as the sensation is similar.  Back was aching like she did with other uti.   Past Medical History  Diagnosis Date  . GERD (gastroesophageal reflux disease)   . Endometriosis    No current outpatient prescriptions on file prior to visit.   No current facility-administered medications on file prior to visit.   No Known Allergies  Review of Systems  Constitutional: Positive for fever, chills, diaphoresis, activity change, appetite change and fatigue. Negative for unexpected weight  change.  HENT: Positive for neck stiffness.   Respiratory: Negative for shortness of breath.   Cardiovascular: Negative for chest pain and leg swelling.  Gastrointestinal: Positive for nausea, diarrhea and abdominal distention. Negative for vomiting, abdominal pain, constipation and blood in stool.  Endocrine: Positive for cold intolerance.  Genitourinary: Positive for decreased urine volume. Negative for dysuria, vaginal bleeding, vaginal discharge and difficulty urinating.  Musculoskeletal: Positive for myalgias, back pain and arthralgias. Negative for gait problem.  Skin: Negative for rash.  Hematological: Negative for adenopathy.  Psychiatric/Behavioral: Positive for sleep disturbance.      BP 105/73  Pulse 97  Temp(Src) 98.5 F (36.9 C) (Oral)  Resp 18  Ht 5\' 5"  (1.651 m)  Wt 165 lb (74.844 kg)  BMI 27.46 kg/m2  LMP 01/19/2013  Breastfeeding? Unknown Objective:   Physical Exam  Constitutional: She is oriented to person, place, and time. She appears well-developed and well-nourished. No distress.  HENT:  Head: Normocephalic and atraumatic.  Neck: Normal range of motion. Neck supple. No thyromegaly present.  Cardiovascular: Normal rate, regular rhythm, normal heart sounds and intact distal pulses.   Pulmonary/Chest: Effort normal and breath sounds normal. No respiratory distress.  Abdominal: Soft. She exhibits no distension and no mass. Bowel sounds are increased. There is no hepatosplenomegaly. There is generalized  tenderness. There is no rigidity, no rebound, no guarding, no CVA tenderness, no tenderness at McBurney's point and negative Murphy's sign. No hernia.  Musculoskeletal: She exhibits no edema.  Lymphadenopathy:    She has no cervical adenopathy.  Neurological: She is alert and oriented to person, place, and time.  Skin: Skin is warm and dry. She is not diaphoretic. No erythema.  Psychiatric: She has a normal mood and affect. Her behavior is normal.      Results  for orders placed in visit on 02/15/13  COMPREHENSIVE METABOLIC PANEL      Result Value Range   Sodium 138  135 - 145 mEq/L   Potassium 4.0  3.5 - 5.3 mEq/L   Chloride 105  96 - 112 mEq/L   CO2 24  19 - 32 mEq/L   Glucose, Bld 93  70 - 99 mg/dL   BUN 9  6 - 23 mg/dL   Creat 1.61 (*) 0.96 - 1.10 mg/dL   Total Bilirubin 0.4  0.3 - 1.2 mg/dL   Alkaline Phosphatase 64  39 - 117 U/L   AST 28  0 - 37 U/L   ALT 28  0 - 35 U/L   Total Protein 7.6  6.0 - 8.3 g/dL   Albumin 4.1  3.5 - 5.2 g/dL   Calcium 8.9  8.4 - 04.5 mg/dL  POCT UA - MICROSCOPIC ONLY      Result Value Range   WBC, Ur, HPF, POC 0-1     RBC, urine, microscopic 2-3     Bacteria, U Microscopic 1+     Mucus, UA positive     Epithelial cells, urine per micros 6-8     Crystals, Ur, HPF, POC neg     Casts, Ur, LPF, POC neg     Yeast, UA neg    POCT URINALYSIS DIPSTICK      Result Value Range   Color, UA amber     Clarity, UA clear     Glucose, UA neg     Bilirubin, UA neg     Ketones, UA trace     Spec Grav, UA 1.025     Blood, UA neg     pH, UA 6.0     Protein, UA 100     Urobilinogen, UA 0.2     Nitrite, UA neg     Leukocytes, UA Negative    POCT CBC      Result Value Range   WBC 9.3  4.6 - 10.2 K/uL   Lymph, poc 0.9  0.6 - 3.4   POC LYMPH PERCENT 9.5 (*) 10 - 50 %L   MID (cbc) 0.4  0 - 0.9   POC MID % 4.0  0 - 12 %M   POC Granulocyte 8.0 (*) 2 - 6.9   Granulocyte percent 86.5 (*) 37 - 80 %G   RBC 4.76  4.04 - 5.48 M/uL   Hemoglobin 14.4  12.2 - 16.2 g/dL   HCT, POC 40.9  81.1 - 47.9 %   MCV 96.1  80 - 97 fL   MCH, POC 30.3  27 - 31.2 pg   MCHC 31.5 (*) 31.8 - 35.4 g/dL   RDW, POC 91.4     Platelet Count, POC 272  142 - 424 K/uL   MPV 9.4  0 - 99.8 fL  POCT URINE PREGNANCY      Result Value Range   Preg Test, Ur Negative      Assessment & Plan:  Abdominal  pain, other specified site - Plan: POCT UA - Microscopic Only, POCT urinalysis dipstick, POCT CBC, Comprehensive metabolic panel, Urine culture,  POCT urine pregnancy  Fever, unspecified - Plan: POCT CBC, Comprehensive metabolic panel, Urine culture  Gastroenteritis and colitis, viral - cbc reassuring that this is likely the viral GI illness going around.  UA not concerning for UTI but due to pt's hx, will send off for clx to ensure.  If no improvement in 1-2d or higher fevers, RTC for further eval.  Push fluids.  Meds ordered this encounter  Medications  . fluticasone (FLONASE) 50 MCG/ACT nasal spray    Sig: Place 2 sprays into the nose daily.  Marland Kitchen omeprazole (PRILOSEC) 20 MG capsule    Sig: Take 20 mg by mouth daily.  . norethindrone-ethinyl estradiol (JUNEL FE,GILDESS FE,LOESTRIN FE) 1-20 MG-MCG tablet    Sig: Take 1 tablet by mouth daily.  . cetirizine (ZYRTEC) 10 MG tablet    Sig: Take 10 mg by mouth daily.  Marland Kitchen loperamide (IMODIUM) 2 MG capsule    Sig: Take 1 capsule (2 mg total) by mouth 4 (four) times daily as needed for diarrhea or loose stools.    Dispense:  30 capsule    Refill:  0  . promethazine (PHENERGAN) 12.5 MG tablet    Sig: Take 1 tablet (12.5 mg total) by mouth every 8 (eight) hours as needed for nausea.    Dispense:  20 tablet    Refill:  0

## 2013-08-15 ENCOUNTER — Other Ambulatory Visit: Payer: Self-pay

## 2013-08-15 DIAGNOSIS — Z1231 Encounter for screening mammogram for malignant neoplasm of breast: Secondary | ICD-10-CM

## 2013-09-15 ENCOUNTER — Other Ambulatory Visit: Payer: Self-pay

## 2013-09-19 ENCOUNTER — Ambulatory Visit
Admission: RE | Admit: 2013-09-19 | Discharge: 2013-09-19 | Disposition: A | Discharge status: Home / Self Care | Payer: Commercial Managed Care - PPO | Source: Ambulatory Visit

## 2013-09-19 DIAGNOSIS — Z1231 Encounter for screening mammogram for malignant neoplasm of breast: Secondary | ICD-10-CM

## 2013-09-20 ENCOUNTER — Other Ambulatory Visit: Payer: Self-pay | Admitting: Obstetrics and Gynecology

## 2013-09-20 DIAGNOSIS — N644 Mastodynia: Secondary | ICD-10-CM

## 2013-09-20 DIAGNOSIS — N63 Unspecified lump in unspecified breast: Secondary | ICD-10-CM

## 2013-09-22 ENCOUNTER — Ambulatory Visit
Admission: RE | Admit: 2013-09-22 | Discharge: 2013-09-22 | Disposition: A | Discharge status: Home / Self Care | Payer: Commercial Managed Care - PPO | Source: Ambulatory Visit | Attending: Obstetrics and Gynecology | Admitting: Obstetrics and Gynecology

## 2013-09-22 DIAGNOSIS — N63 Unspecified lump in unspecified breast: Secondary | ICD-10-CM

## 2013-09-22 DIAGNOSIS — N644 Mastodynia: Secondary | ICD-10-CM

## 2014-11-10 NOTE — L&D Delivery Note (Signed)
Delivery Note Pt received epidural and rapidly progressed to complete dilation.    She pushed well. At 10:16 AM a healthy female was delivered via  (Presentation: OP ).  APGAR:8 ,9 ; weight pending  .   Placenta status: delivered spontaneously .  Cord:  with the following complications:nuchal x 1, reduced over head.    Anesthesia:  epidural Episiotomy:  none Lacerations:  Small second Suture Repair: 3.0 vicryl rapide Est. Blood Loss (mL):   Mom to postpartum.  Baby to Couplet care / Skin to Skin. Private cord blood banking collected  Hollister Wessler W 07/25/2015, 10:35 AM

## 2014-12-25 LAB — OB RESULTS CONSOLE ABO/RH: RH TYPE: POSITIVE

## 2014-12-25 LAB — OB RESULTS CONSOLE HIV ANTIBODY (ROUTINE TESTING): HIV: NONREACTIVE

## 2014-12-25 LAB — OB RESULTS CONSOLE RPR: RPR: NONREACTIVE

## 2014-12-25 LAB — OB RESULTS CONSOLE RUBELLA ANTIBODY, IGM: RUBELLA: IMMUNE

## 2014-12-25 LAB — OB RESULTS CONSOLE HEPATITIS B SURFACE ANTIGEN: Hepatitis B Surface Ag: NEGATIVE

## 2014-12-25 LAB — OB RESULTS CONSOLE GC/CHLAMYDIA
Chlamydia: NEGATIVE
Gonorrhea: NEGATIVE

## 2014-12-25 LAB — OB RESULTS CONSOLE ANTIBODY SCREEN: ANTIBODY SCREEN: NEGATIVE

## 2015-01-09 ENCOUNTER — Encounter (HOSPITAL_COMMUNITY): Payer: Self-pay | Admitting: *Deleted

## 2015-01-22 NOTE — H&P (Signed)
Karen Norman is a 34 y.o. female G3P1102 at 13+ weeks (EDD 07/31/15 by LMP c/Norman 7 week US) presenting for cervical cerclage placement given a history of severe PTD at 23+ weeks.  Pt presented to hospital with that pregnancy with advanced dilation, minimal cramping and delivered 2 days after admission.  There was a question of postpartum endometritis, but otherwise no clear cause.  First pregnancy delivered at 40 weeks. She will also do 17-P weekly at [redacted] weeks gestation.   History NSVD x 2 as above  2010 40 weeks 7#6oz 2012 23 weeks 1#5oz   Past Medical History  Diagnosis Date  . Endometriosis   . SVD (spontaneous vaginal delivery)     x 2  . Seasonal allergies   . GERD (gastroesophageal reflux disease)     tums prn   Past Surgical History  Procedure Laterality Date  . Knee surgery      left  . Laparotomy      left ovary  . Wisdom tooth extraction     Family History: family history includes Cancer in her maternal grandmother; Heart disease in her father; Hypertension in her father. Social History:  reports that she has never smoked. She has never used smokeless tobacco. She reports that she does not drink alcohol or use illicit drugs.   Prenatal Transfer Tool  Maternal Diabetes: No Genetic Screening: Normal Maternal Ultrasounds/Referrals: Normal Fetal Ultrasounds or other Referrals:  None Maternal Substance Abuse:  No Significant Maternal Medications:  None Significant Maternal Lab Results:  None Other Comments:  None  ROS    Height 5\' 4"  (1.626 m), weight 79.379 kg (175 lb), last menstrual period 10/24/2014, unknown if currently breastfeeding. Maternal Exam:  Abdomen: Patient reports no abdominal tenderness. Introitus: Normal vulva. Normal vagina.    Physical Exam  Constitutional: She appears well-developed and well-nourished.  Cardiovascular: Normal rate and regular rhythm.   Respiratory: Effort normal.  GI: Soft.  Genitourinary: Vagina normal.  Uterus 13 weeks  size  Neurological: She is alert.  Psychiatric: She has a normal mood and affect.    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  Risks and benfits of cerclage were d/Norman the patient in detail including bleeding and possible ROM which would result in pregnancy loss.  She desires to proceed.   Oliver PilaRICHARDSON,Karen Norman 01/22/2015, 4:32 PM

## 2015-01-26 ENCOUNTER — Ambulatory Visit (HOSPITAL_COMMUNITY): Payer: Commercial Managed Care - PPO | Admitting: Anesthesiology

## 2015-01-26 ENCOUNTER — Ambulatory Visit (HOSPITAL_COMMUNITY)
Admission: RE | Admit: 2015-01-26 | Discharge: 2015-01-26 | Disposition: A | Discharge status: Home / Self Care | Payer: Commercial Managed Care - PPO | Source: Ambulatory Visit | Attending: Obstetrics and Gynecology | Admitting: Obstetrics and Gynecology

## 2015-01-26 ENCOUNTER — Encounter (HOSPITAL_COMMUNITY)
Admission: RE | Disposition: A | Discharge status: Home / Self Care | Payer: Self-pay | Source: Ambulatory Visit | Attending: Obstetrics and Gynecology

## 2015-01-26 ENCOUNTER — Encounter (HOSPITAL_COMMUNITY): Payer: Self-pay | Admitting: *Deleted

## 2015-01-26 DIAGNOSIS — O26892 Other specified pregnancy related conditions, second trimester: Secondary | ICD-10-CM | POA: Diagnosis not present

## 2015-01-26 DIAGNOSIS — Z3A23 23 weeks gestation of pregnancy: Secondary | ICD-10-CM | POA: Diagnosis not present

## 2015-01-26 DIAGNOSIS — O3432 Maternal care for cervical incompetence, second trimester: Secondary | ICD-10-CM | POA: Diagnosis present

## 2015-01-26 DIAGNOSIS — K219 Gastro-esophageal reflux disease without esophagitis: Secondary | ICD-10-CM | POA: Insufficient documentation

## 2015-01-26 HISTORY — PX: CERVICAL CERCLAGE: SHX1329

## 2015-01-26 HISTORY — DX: Other seasonal allergic rhinitis: J30.2

## 2015-01-26 LAB — CBC
HCT: 39.7 % (ref 36.0–46.0)
HEMOGLOBIN: 13.3 g/dL (ref 12.0–15.0)
MCH: 31.3 pg (ref 26.0–34.0)
MCHC: 33.5 g/dL (ref 30.0–36.0)
MCV: 93.4 fL (ref 78.0–100.0)
Platelets: 237 10*3/uL (ref 150–400)
RBC: 4.25 MIL/uL (ref 3.87–5.11)
RDW: 13.5 % (ref 11.5–15.5)
WBC: 9.3 10*3/uL (ref 4.0–10.5)

## 2015-01-26 SURGERY — CERCLAGE, CERVIX, VAGINAL APPROACH
Anesthesia: Spinal | Site: Vagina

## 2015-01-26 MED ORDER — FENTANYL CITRATE 0.05 MG/ML IJ SOLN
INTRAMUSCULAR | Status: AC
  Filled 2015-01-26: qty 2

## 2015-01-26 MED ORDER — FENTANYL CITRATE 0.05 MG/ML IJ SOLN
25.0000 ug | INTRAMUSCULAR | Status: DC | PRN
  Administered 2015-01-26 (×2): 50 ug via INTRAVENOUS

## 2015-01-26 MED ORDER — FENTANYL CITRATE 0.05 MG/ML IJ SOLN
INTRAMUSCULAR | Status: AC
  Filled 2015-01-26: qty 5

## 2015-01-26 MED ORDER — ACETAMINOPHEN 160 MG/5ML PO SOLN: 650.0000 mg | Freq: Four times a day (QID) | ORAL | Status: DC | PRN

## 2015-01-26 MED ORDER — ONDANSETRON HCL 4 MG/2ML IJ SOLN
INTRAMUSCULAR | Status: DC | PRN
  Administered 2015-01-26: 4 mg via INTRAVENOUS

## 2015-01-26 MED ORDER — SCOPOLAMINE 1 MG/3DAYS TD PT72
1.0000 | MEDICATED_PATCH | Freq: Once | TRANSDERMAL | Status: DC
  Administered 2015-01-26: 1.5 mg via TRANSDERMAL

## 2015-01-26 MED ORDER — SCOPOLAMINE 1 MG/3DAYS TD PT72
MEDICATED_PATCH | TRANSDERMAL | Status: AC
  Administered 2015-01-26: 1.5 mg via TRANSDERMAL
  Filled 2015-01-26: qty 1

## 2015-01-26 MED ORDER — DEXAMETHASONE SODIUM PHOSPHATE 4 MG/ML IJ SOLN
INTRAMUSCULAR | Status: AC
  Filled 2015-01-26: qty 1

## 2015-01-26 MED ORDER — LACTATED RINGERS IV SOLN: INTRAVENOUS | Status: DC

## 2015-01-26 MED ORDER — LACTATED RINGERS IV SOLN
INTRAVENOUS | Status: DC
  Administered 2015-01-26 (×2): via INTRAVENOUS

## 2015-01-26 MED ORDER — FENTANYL CITRATE 0.05 MG/ML IJ SOLN
INTRAMUSCULAR | Status: DC | PRN
  Administered 2015-01-26 (×3): 50 ug via INTRAVENOUS

## 2015-01-26 MED ORDER — ONDANSETRON HCL 4 MG/2ML IJ SOLN
INTRAMUSCULAR | Status: AC
  Filled 2015-01-26: qty 2

## 2015-01-26 SURGICAL SUPPLY — 19 items
CATH ROBINSON RED A/P 16FR (CATHETERS) ×3 IMPLANT
CLOTH BEACON ORANGE TIMEOUT ST (SAFETY) ×3 IMPLANT
COUNTER NEEDLE 1200 MAGNETIC (NEEDLE) ×3 IMPLANT
GLOVE BIO SURGEON STRL SZ 6.5 (GLOVE) ×4 IMPLANT
GLOVE BIO SURGEONS STRL SZ 6.5 (GLOVE) ×2
GOWN STRL REUS W/TWL LRG LVL3 (GOWN DISPOSABLE) ×6 IMPLANT
NEEDLE ANCHOR KEITH 2 7/8 STR (NEEDLE) ×3 IMPLANT
NEEDLE MAYO .5 CIRCLE (NEEDLE) ×3 IMPLANT
PACK VAGINAL MINOR WOMEN LF (CUSTOM PROCEDURE TRAY) ×3 IMPLANT
PAD OB MATERNITY 4.3X12.25 (PERSONAL CARE ITEMS) ×3 IMPLANT
PAD PREP 24X48 CUFFED NSTRL (MISCELLANEOUS) ×3 IMPLANT
SUT POLYDEK 5 CE 75 36 (SUTURE) ×6 IMPLANT
SYR BULB IRRIGATION 50ML (SYRINGE) ×3 IMPLANT
TOWEL OR 17X24 6PK STRL BLUE (TOWEL DISPOSABLE) ×6 IMPLANT
TRAY FOLEY CATH 14FR (SET/KITS/TRAYS/PACK) ×3 IMPLANT
TUBING NON-CON 1/4 X 20 CONN (TUBING) ×2 IMPLANT
TUBING NON-CON 1/4 X 20' CONN (TUBING) ×1
WATER STERILE IRR 1000ML POUR (IV SOLUTION) ×3 IMPLANT
YANKAUER SUCT BULB TIP NO VENT (SUCTIONS) ×3 IMPLANT

## 2015-01-26 NOTE — Op Note (Signed)
Operative Note  Preoperative Diagnosis Incompetent cervix Prior preterm delivery at 23+ weeks  Postoperative diagnosis Same  Procedure McDonald cervical cerclage 2  Surgeon Dr. Huel CoteKathy Voula Waln  Anesthesia Spinal  Fluids Estimated blood loss 50 cc Urine output 150 cc clear urine IV fluid 1200 cc LR  Findings The cervix appeared long and closed.  The uterus was 13-14 weeks size consistent with her gestational age. Fetal heart tones were 150 both prior to and after the procedure.  Procedure note Patient was taken to the operating room where spinal anesthesia was obtained without difficulty. She was then prepped and draped in the normal sterile fashion in the dorsal lithotomy position. An appropriate time out was performed. A weighted speculum was placed in the vagina and cervix identified and examined and seemed to be normal in appearance in length. The anterior lip was grasped with the slotted ring forcep and a circumferential suture of 0 Polydek was placed beginning at 12:00. When the stitch had been completed back to the 12:00 position the cervix was once again examined and the decision made to place a second suture. With slight tension on the first suture a second pursestring suture of 0 Polydek was placed beginning at 12:00 and traveling counterclockwise circumferentially until back to the 12:00 position. At the conclusion of the procedure both sutures were tied down firmly and the cervix was closed. There is no active bleeding. The vagina was irrigated multiple times and no bleeding noted. The patient was then taken to the recovery room in good condition.

## 2015-01-26 NOTE — Progress Notes (Signed)
Patient ID: Karen Norman, female   DOB: 01/10/1981, 34 y.o.   MRN: [id number] Per pt no changes in dictated H&P.  Brief exam WNL.  Ready to proceed.

## 2015-01-26 NOTE — Anesthesia Postprocedure Evaluation (Signed)
  Anesthesia Post-op Note  Patient: Karen Norman  Procedure(s) Performed: Procedure(s): CERCLAGE CERVICAL (N/A)  Patient is awake, responsive, moving her legs, and has signs of resolution of her numbness. Pain and nausea are reasonably well controlled. Vital signs are stable and clinically acceptable. Oxygen saturation is clinically acceptable. There are no apparent anesthetic complications at this time. Patient is ready for discharge.

## 2015-01-26 NOTE — Transfer of Care (Signed)
Immediate Anesthesia Transfer of Care Note  Patient: Karen Norman  Procedure(s) Performed: Procedure(s): CERCLAGE CERVICAL (N/A)  Patient Location: PACU  Anesthesia Type:Spinal  Level of Consciousness: awake, alert  and oriented  Airway & Oxygen Therapy: Patient Spontanous Breathing  Post-op Assessment: Report given to RN and Post -op Vital signs reviewed and stable  Post vital signs: Reviewed and stable  Last Vitals:  Filed Vitals:   01/26/15 0615  BP: 115/71  Pulse:   Temp:   Resp:     Complications: No apparent anesthesia complications

## 2015-01-26 NOTE — Discharge Instructions (Signed)
°  Cervical Cerclage, Care After °Refer to this sheet in the next few weeks. These instructions provide you with information on caring for yourself after your procedure. Your health care provider may also give you more specific instructions. Your treatment has been planned according to current medical practices, but problems sometimes occur. Call your health care provider if you have any problems or questions after your procedure. °WHAT TO EXPECT AFTER THE PROCEDURE  °After your procedure, it is typical to have the following: °· Abdominal cramping. °· Vaginal spotting. °HOME CARE INSTRUCTIONS  °· Only take over-the-counter or prescription medicines for pain, discomfort, or fever as directed by your health care provider. °· Avoid physical activities and exercise until your health care provider says it is okay. °· Do not douche or have sexual intercourse until your health care provider tells you it is okay. °· Keep your follow-up surgical and prenatal appointments with your health care provider. °SEEK MEDICAL CARE IF:  °· You have abnormal vaginal discharge. °· You have a rash. °· You become lightheaded or feel faint. °· You have abdominal pain that is not controlled with pain medicine. °SEEK IMMEDIATE MEDICAL CARE IF:  °· You develop vaginal bleeding. °· You are leaking fluid or have a gush of fluid from the vagina. °· You have a fever. °· You faint. °· You have uterine contractions. °· You feel your baby is not moving as much as usual, or you cannot feel your baby move. °· You have chest pain or shortness of breath. °Document Released: 08/17/2013 Document Revised: 11/01/2013 Document Reviewed: 08/17/2013 °Exit[employer]® Patient Information ©2015 [employer]. This information is not intended to replace advice given to you by your health care provider. Make sure you discuss any questions you have with your health care provider. ° °

## 2015-01-26 NOTE — Anesthesia Preprocedure Evaluation (Signed)
Anesthesia Evaluation  Patient identified by MRN, date of birth, ID band Patient awake    Reviewed: Allergy & Precautions, H&P , NPO status , Patient's Chart, lab work & pertinent test results  Airway Mallampati: II       Dental   Pulmonary  breath sounds clear to auscultation        Cardiovascular Exercise Tolerance: Good Rhythm:regular Rate:Normal     Neuro/Psych    GI/Hepatic GERD-  Controlled,  Endo/Other    Renal/GU      Musculoskeletal   Abdominal   Peds  Hematology   Anesthesia Other Findings   Reproductive/Obstetrics (+) Pregnancy                             Anesthesia Physical Anesthesia Plan  ASA: II  Anesthesia Plan: Spinal   Post-op Pain Management:    Induction:   Airway Management Planned:   Additional Equipment:   Intra-op Plan:   Post-operative Plan:   Informed Consent: I have reviewed the patients History and Physical, chart, labs and discussed the procedure including the risks, benefits and alternatives for the proposed anesthesia with the patient or authorized representative who has indicated his/her understanding and acceptance.     Plan Discussed with: Anesthesiologist, CRNA and Surgeon  Anesthesia Plan Comments:         Anesthesia Quick Evaluation  

## 2015-01-26 NOTE — Anesthesia Procedure Notes (Signed)
Spinal Patient location during procedure: OR Preanesthetic Checklist Completed: patient identified, site marked, surgical consent, pre-op evaluation, timeout performed, IV checked, risks and benefits discussed and monitors and equipment checked Spinal Block Patient position: sitting Prep: DuraPrep Patient monitoring: heart rate, cardiac monitor, continuous pulse ox and blood pressure Approach: midline Location: L3-4 Injection technique: single-shot Needle Needle type: Sprotte  Needle gauge: 24 G Needle length: 9 cm Assessment Sensory level: T10 Additional Notes 5% Xylo 1.0cc = SAB

## 2015-01-29 ENCOUNTER — Encounter (HOSPITAL_COMMUNITY): Payer: Self-pay | Admitting: Obstetrics and Gynecology

## 2015-05-03 ENCOUNTER — Ambulatory Visit (INDEPENDENT_AMBULATORY_CARE_PROVIDER_SITE_OTHER): Payer: Commercial Managed Care - PPO | Admitting: Family Medicine

## 2015-05-03 VITALS — BP 118/74 | HR 92 | Temp 99.0°F | Resp 18 | Ht 65.0 in | Wt 194.0 lb

## 2015-05-03 DIAGNOSIS — S61219A Laceration without foreign body of unspecified finger without damage to nail, initial encounter: Secondary | ICD-10-CM

## 2015-05-03 DIAGNOSIS — S61211A Laceration without foreign body of left index finger without damage to nail, initial encounter: Secondary | ICD-10-CM | POA: Diagnosis not present

## 2015-05-03 NOTE — Progress Notes (Signed)
Procedure: Verbal consent obtained. Skin was anesthetized with 1.5 cc 1% lido without epi and cleaned with soap and water. #1 horizontal and #1 simple 5.0 ethilon sutures placed. Wound was dressed and wound care discussed.

## 2015-05-03 NOTE — Progress Notes (Signed)
Chief Complaint:  Chief Complaint  Patient presents with  . Finger Injury    left index finger. happened around 1045am this morning.     HPI: Karen Norman is a 34 y.o. female who is here for left finger laceration which occurred this morning while she was cutting plums to blend in a smoothie for her child. She is right-hand dominant.  She is pregnant. She is up-to-date on her tetanus. Denies any numbness weakness or tingling.  Past Medical History  Diagnosis Date  . Endometriosis   . SVD (spontaneous vaginal delivery)     x 2  . Seasonal allergies   . GERD (gastroesophageal reflux disease)     tums prn   Past Surgical History  Procedure Laterality Date  . Knee surgery      left  . Laparotomy      left ovary  . Wisdom tooth extraction    . Cervical cerclage N/A 01/26/2015    Procedure: CERCLAGE CERVICAL;  Surgeon: Huel Cote, MD;  Location: [facility];  Service: Gynecology;  Laterality: N/A;   History   Social History  . Marital Status: Married    Spouse Name: N/A  . Number of Children: N/A  . Years of Education: N/A   Social History Main Topics  . Smoking status: Never Smoker   . Smokeless tobacco: Never Used  . Alcohol Use: No  . Drug Use: No  . Sexual Activity: Yes    Birth Control/ Protection: None     Comment: approx 11 wlks gestation as of 01/09/15   Other Topics Concern  . None   Social History Narrative   Family History  Problem Relation Age of Onset  . Hypertension Father   . Heart disease Father   . Cancer Maternal Grandmother    No Known Allergies Prior to Admission medications   Medication Sig Start Date End Date Taking? Authorizing Provider  acetaminophen (TYLENOL) 500 MG tablet Take 1,000 mg by mouth every 6 (six) hours as needed for moderate pain.   Yes Historical Provider, MD  calcium carbonate (TUMS - DOSED IN MG ELEMENTAL CALCIUM) 500 MG chewable tablet Chew 2 tablets by mouth daily as needed for indigestion or heartburn.    Yes Historical Provider, MD  cetirizine (ZYRTEC) 10 MG tablet Take 10 mg by mouth daily.   Yes Historical Provider, MD  diphenhydrAMINE (BENADRYL) 25 MG tablet Take 25-50 mg by mouth every 6 (six) hours as needed for allergies.   Yes Historical Provider, MD  Prenatal Vit-Fe Fumarate-FA (PRENATAL MULTIVITAMIN) TABS tablet Take 1 tablet by mouth daily at 12 noon.   Yes Historical Provider, MD     ROS: The patient denies fevers, chills, night sweats, unintentional weight loss, chest pain, palpitations, wheezing, dyspnea on exertion, nausea, vomiting, abdominal pain, dysuria, hematuria, melena, numbness, weakness, or tingling.   All other systems have been reviewed and were otherwise negative with the exception of those mentioned in the HPI and as above.    PHYSICAL EXAM: Filed Vitals:   05/03/15 1157  BP: 118/74  Pulse: 92  Temp: 99 F (37.2 C)  Resp: 18   Filed Vitals:   05/03/15 1157  Height: 5\' 5"  (1.651 m)  Weight: 194 lb (87.998 kg)   Body mass index is 32.28 kg/(m^2).   General: Alert, no acute distress HEENT:  Normocephalic, atraumatic, oropharynx patent. EOMI, PERRLA Cardiovascular:  Regular rate and rhythm, no rubs murmurs or gallops. Radial pulse intact. No pedal edema.  Respiratory: Clear to auscultation bilaterally.  No wheezes, rales, or rhonchi.  No cyanosis, no use of accessory musculature GI: No organomegaly, abdomen is soft and non-tender, positive bowel sounds.  No masses. Skin: No rashes. Neurologic: Facial musculature symmetric. Psychiatric: Patient is appropriate throughout our interaction. Lymphatic: No cervical lymphadenopathy Musculoskeletal: Gait intact. +1/4 inch left index laceration, deep, no tendon exposure, positive bleeding Sensation intact  LABS: Results for orders placed or performed during the hospital encounter of 01/26/15  CBC  Result Value Ref Range   WBC 9.3 4.0 - 10.5 K/uL   RBC 4.25 3.87 - 5.11 MIL/uL   Hemoglobin 13.3 12.0 - 15.0  g/dL   HCT 16.1 09.6 - 04.5 %   MCV 93.4 78.0 - 100.0 fL   MCH 31.3 26.0 - 34.0 pg   MCHC 33.5 30.0 - 36.0 g/dL   RDW 40.9 81.1 - 91.4 %   Platelets 237 150 - 400 K/uL     EKG/XRAY:   Primary read interpreted by Dr. Conley Rolls at [facility].   ASSESSMENT/PLAN: Encounter Diagnosis  Name Primary?  . Finger laceration, initial encounter Yes   [name] is a pleasant 34 year old female who is pregnant who presents for a left index finger laceration  She is up-to-date on her tetanus Wound Care as directed Follow-up as directed  Gross sideeffects, risk and benefits, and alternatives of medications d/w patient. Patient is aware that all medications have potential sideeffects and we are unable to predict every sideeffect or drug-drug interaction that may occur.  Hamilton Capri PHUONG, DO 05/03/2015 12:56 PM

## 2015-05-03 NOTE — Patient Instructions (Signed)
Change dressings daily as long as wound is draining. Once not draining anymore, may leave wound open to the air. Do not get wet for 24 hours - after 24 hours may get wet with soap and water but do not scrub. If wound gets infected - skin around gets warm, red, or painful or if starts draining pus or if you develop fever or chills - return to be seen. Return in 7-10 days for suture removal.

## 2015-05-06 ENCOUNTER — Other Ambulatory Visit: Payer: Self-pay | Admitting: Family Medicine

## 2015-05-06 NOTE — Addendum Note (Signed)
Addended by: Lenell Antu on: 05/06/2015 09:00 AM   Modules accepted: Level of Service

## 2015-05-23 ENCOUNTER — Encounter: Payer: Commercial Managed Care - PPO | Attending: Obstetrics and Gynecology

## 2015-05-23 VITALS — Ht 60.0 in | Wt 191.1 lb

## 2015-05-23 DIAGNOSIS — O24410 Gestational diabetes mellitus in pregnancy, diet controlled: Secondary | ICD-10-CM | POA: Diagnosis present

## 2015-05-23 DIAGNOSIS — Z3A00 Weeks of gestation of pregnancy not specified: Secondary | ICD-10-CM | POA: Diagnosis not present

## 2015-05-23 DIAGNOSIS — Z713 Dietary counseling and surveillance: Secondary | ICD-10-CM | POA: Diagnosis not present

## 2015-05-23 DIAGNOSIS — Z6837 Body mass index (BMI) 37.0-37.9, adult: Secondary | ICD-10-CM | POA: Insufficient documentation

## 2015-05-27 NOTE — Progress Notes (Signed)
  Patient was seen on 05/23/15 for Gestational Diabetes self-management . The following learning objectives were met by the patient :   States the definition of Gestational Diabetes  States why dietary management is important in controlling blood glucose  Describes the effects of carbohydrates on blood glucose levels  Demonstrates ability to create a balanced meal plan  Demonstrates carbohydrate counting   States when to check blood glucose levels  Demonstrates proper blood glucose monitoring techniques  States the effect of stress and exercise on blood glucose levels  States the importance of limiting caffeine and abstaining from alcohol and smoking  Plan:  Aim for 2 Carb Choices per meal (30 grams) +/- 1 either way for breakfast Aim for 3 Carb Choices per meal (45 grams) +/- 1 either way from lunch and dinner Aim for 1-2 Carbs per snack Begin reading food labels for Total Carbohydrate and sugar grams of foods Consider  increasing your activity level by walking daily as tolerated Begin checking BG before breakfast and 2 hours after first bit of breakfast, lunch and dinner after  as directed by MD  Take medication  as directed by MD  Blood glucose monitor given: Accu-Chek [employer] Lot # [id number] Exp: 03/09/16 Blood glucose reading: $RemoveBeforeDE'79mg'ywKAxticvYGvlIX$ /dl  Patient instructed to monitor glucose levels: FBS: 60 - <90 2 hour: <120  Patient received the following handouts:  Nutrition Diabetes and Pregnancy  Carbohydrate Counting List  Meal Planning worksheet  Patient will be seen for follow-up as needed.

## 2015-06-27 LAB — OB RESULTS CONSOLE GBS: GBS: NEGATIVE

## 2015-07-20 ENCOUNTER — Encounter (HOSPITAL_COMMUNITY): Payer: Self-pay | Admitting: *Deleted

## 2015-07-20 ENCOUNTER — Telehealth (HOSPITAL_COMMUNITY): Payer: Self-pay | Admitting: *Deleted

## 2015-07-20 NOTE — Telephone Encounter (Signed)
Preadmission screen  

## 2015-07-24 NOTE — H&P (Signed)
Karen Norman is a 34 y.o. female [id number] at 42 1/7 weeks (EDD 07/31/15 by LMP c/w 7 week [place]) presenting for IOL at term.  Prenatal care complicated by a h/o preterm delivery last pregnancy after pt was admitted with advanced cervical dilation and delivered at 23+ weeks after 2 days in the hospital.  Suspected cervical incompetence and she  received a cerclage this pregnancy at 14 weeks, and 17-P from weeks 15-36 gestation.  Her cerclage was removed 07/04/15.  She also had GDM well-controlled on diet and was AMA with a normal Panorama test.    Maternal Medical History:  Contractions: Frequency: irregular.   Perceived severity is mild.    Prenatal Complications - Diabetes: gestational. Diabetes is managed by diet.      OB History    Gravida Para Term Preterm AB TAB SAB Ectopic Multiple Living   2010 vacuum 7#6oz 2012 NSVD 1#5oz at 23 weeks  Developmentally delayed  Past Medical History  Diagnosis Date  . Endometriosis   . SVD (spontaneous vaginal delivery)     x 2  . Seasonal allergies   . GERD (gastroesophageal reflux disease)     tums prn  . Gestational diabetes mellitus, antepartum    Past Surgical History  Procedure Laterality Date  . Knee surgery      left  . Laparotomy      left ovary  . Wisdom tooth extraction    . Cervical cerclage N/A 01/26/2015    Procedure: CERCLAGE CERVICAL;  Surgeon: Huel Cote, MD;  Location: [facility];  Service: Gynecology;  Laterality: N/A;   Family History: family history includes Cancer in her maternal grandmother; Heart disease in her father; Hypertension in her father. Social History:  reports that she has never smoked. She has never used smokeless tobacco. She reports that she does not drink alcohol or use illicit drugs.   Prenatal Transfer Tool  Maternal Diabetes: Yes:  Diabetes Type:  Diet controlled Genetic Screening: Normal Maternal Ultrasounds/Referrals: Normal Fetal Ultrasounds or other Referrals:   None Maternal Substance Abuse:  No Significant Maternal Medications:  Meds include: Progesterone Significant Maternal Lab Results:  None Other Comments:  None  ROS    Last menstrual period 10/24/2014, unknown if currently breastfeeding. Maternal Exam:  Uterine Assessment: Contraction strength is mild.  Contraction frequency is irregular.   Abdomen: Patient reports no abdominal tenderness. Fetal presentation: vertex  Introitus: Normal vulva. Normal vagina.    Physical Exam  Constitutional: She is oriented to person, place, and time. She appears well-developed and well-nourished.  Cardiovascular: Normal rate.   Respiratory: Effort normal.  GI: Soft.  Genitourinary: Vagina normal and uterus normal.  Neurological: She is alert and oriented to person, place, and time.  Psychiatric: She has a normal mood and affect.    Prenatal labs: ABO, Rh: O/Positive/-- (02/15 0000) Antibody: Negative (02/15 0000) Rubella: Immune (02/15 0000) RPR: Nonreactive (02/15 0000)  HBsAg: Negative (02/15 0000)  HIV: Non-reactive (02/15 0000)  GBS: Negative (08/17 0000)  CF negative Panorama low risk female One hour GTT 165 Three hour 71/188/151/143  Assessment/Plan: Pt with h/o 23 week delivery and now s/p cerclage removal at 39+ weeks with favorable cervix. Plan AROM and pitocin   Karen Norman W 07/24/2015, 10:55 PM

## 2015-07-25 ENCOUNTER — Inpatient Hospital Stay (HOSPITAL_COMMUNITY): Payer: Commercial Managed Care - PPO | Admitting: Anesthesiology

## 2015-07-25 ENCOUNTER — Encounter (HOSPITAL_COMMUNITY): Payer: Self-pay

## 2015-07-25 ENCOUNTER — Inpatient Hospital Stay (HOSPITAL_COMMUNITY)
Admission: AD | Admit: 2015-07-25 | Discharge: 2015-07-27 | DRG: 775 | Disposition: A | Discharge status: Home / Self Care | Payer: Commercial Managed Care - PPO | Source: Ambulatory Visit | Attending: Obstetrics and Gynecology | Admitting: Obstetrics and Gynecology

## 2015-07-25 DIAGNOSIS — N809 Endometriosis, unspecified: Secondary | ICD-10-CM | POA: Diagnosis present

## 2015-07-25 DIAGNOSIS — O24420 Gestational diabetes mellitus in childbirth, diet controlled: Secondary | ICD-10-CM | POA: Diagnosis present

## 2015-07-25 DIAGNOSIS — Z8249 Family history of ischemic heart disease and other diseases of the circulatory system: Secondary | ICD-10-CM | POA: Diagnosis not present

## 2015-07-25 DIAGNOSIS — Z809 Family history of malignant neoplasm, unspecified: Secondary | ICD-10-CM

## 2015-07-25 DIAGNOSIS — Z3A39 39 weeks gestation of pregnancy: Secondary | ICD-10-CM | POA: Diagnosis not present

## 2015-07-25 DIAGNOSIS — O09523 Supervision of elderly multigravida, third trimester: Secondary | ICD-10-CM

## 2015-07-25 DIAGNOSIS — O9962 Diseases of the digestive system complicating childbirth: Secondary | ICD-10-CM | POA: Diagnosis present

## 2015-07-25 DIAGNOSIS — K219 Gastro-esophageal reflux disease without esophagitis: Secondary | ICD-10-CM | POA: Diagnosis present

## 2015-07-25 DIAGNOSIS — O3433 Maternal care for cervical incompetence, third trimester: Secondary | ICD-10-CM | POA: Diagnosis present

## 2015-07-25 LAB — BASIC METABOLIC PANEL
ANION GAP: 11 (ref 5–15)
BUN: 8 mg/dL (ref 6–20)
CHLORIDE: 105 mmol/L (ref 101–111)
CO2: 19 mmol/L — ABNORMAL LOW (ref 22–32)
Calcium: 8.7 mg/dL — ABNORMAL LOW (ref 8.9–10.3)
Creatinine, Ser: 0.65 mg/dL (ref 0.44–1.00)
GFR calc Af Amer: >60 mL/min (ref >60)
Glucose, Bld: 80 mg/dL (ref 65–99)
POTASSIUM: 3.7 mmol/L (ref 3.5–5.1)
SODIUM: 135 mmol/L (ref 135–145)

## 2015-07-25 LAB — RPR: RPR: NONREACTIVE

## 2015-07-25 LAB — CBC
HCT: 38 % (ref 36.0–46.0)
HEMOGLOBIN: 12.9 g/dL (ref 12.0–15.0)
MCH: 32.4 pg (ref 26.0–34.0)
MCHC: 33.9 g/dL (ref 30.0–36.0)
MCV: 95.5 fL (ref 78.0–100.0)
Platelets: 237 10*3/uL (ref 150–400)
RBC: 3.98 MIL/uL (ref 3.87–5.11)
RDW: 14.2 % (ref 11.5–15.5)
WBC: 9.1 10*3/uL (ref 4.0–10.5)

## 2015-07-25 LAB — ABO/RH: ABO/RH(D): O POS

## 2015-07-25 LAB — TYPE AND SCREEN
ABO/RH(D): O POS
Antibody Screen: NEGATIVE

## 2015-07-25 MED ORDER — PRENATAL MULTIVITAMIN CH
1.0000 | ORAL_TABLET | Freq: Every day | ORAL | Status: DC
  Administered 2015-07-25 – 2015-07-27 (×4): 1 via ORAL
  Filled 2015-07-25 (×3): qty 1

## 2015-07-25 MED ORDER — OXYCODONE-ACETAMINOPHEN 5-325 MG PO TABS: 2.0000 | ORAL_TABLET | ORAL | Status: DC | PRN

## 2015-07-25 MED ORDER — TERBUTALINE SULFATE 1 MG/ML IJ SOLN
0.2500 mg | Freq: Once | INTRAMUSCULAR | Status: DC | PRN
  Filled 2015-07-25: qty 1

## 2015-07-25 MED ORDER — WITCH HAZEL-GLYCERIN EX PADS
1.0000 "application " | MEDICATED_PAD | CUTANEOUS | Status: DC | PRN
  Administered 2015-07-25: 1 via TOPICAL

## 2015-07-25 MED ORDER — ONDANSETRON HCL 4 MG PO TABS: 4.0000 mg | ORAL_TABLET | ORAL | Status: DC | PRN

## 2015-07-25 MED ORDER — SIMETHICONE 80 MG PO CHEW: 80.0000 mg | CHEWABLE_TABLET | ORAL | Status: DC | PRN

## 2015-07-25 MED ORDER — ONDANSETRON HCL 4 MG/2ML IJ SOLN: 4.0000 mg | Freq: Four times a day (QID) | INTRAMUSCULAR | Status: DC | PRN

## 2015-07-25 MED ORDER — OXYTOCIN BOLUS FROM INFUSION: 500.0000 mL | INTRAVENOUS | Status: DC

## 2015-07-25 MED ORDER — OXYTOCIN 40 UNITS IN LACTATED RINGERS INFUSION - SIMPLE MED
62.5000 mL/h | INTRAVENOUS | Status: DC
  Administered 2015-07-25: 62.5 mL/h via INTRAVENOUS

## 2015-07-25 MED ORDER — ACETAMINOPHEN 325 MG PO TABS: 650.0000 mg | ORAL_TABLET | ORAL | Status: DC | PRN

## 2015-07-25 MED ORDER — ONDANSETRON HCL 4 MG/2ML IJ SOLN: 4.0000 mg | INTRAMUSCULAR | Status: DC | PRN

## 2015-07-25 MED ORDER — OXYCODONE-ACETAMINOPHEN 5-325 MG PO TABS
1.0000 | ORAL_TABLET | ORAL | Status: DC | PRN
  Administered 2015-07-26 (×3): 1 via ORAL
  Filled 2015-07-25 (×3): qty 1

## 2015-07-25 MED ORDER — FENTANYL 2.5 MCG/ML BUPIVACAINE 1/10 % EPIDURAL INFUSION (WH - ANES)
14.0000 mL/h | INTRAMUSCULAR | Status: DC | PRN
  Administered 2015-07-25 (×2): 14 mL/h via EPIDURAL
  Filled 2015-07-25: qty 125

## 2015-07-25 MED ORDER — LORATADINE 10 MG PO TABS
10.0000 mg | ORAL_TABLET | Freq: Every day | ORAL | Status: DC
Start: 2015-07-25 — End: 2015-07-27
  Administered 2015-07-26 – 2015-07-27 (×2): 10 mg via ORAL
  Filled 2015-07-25 (×3): qty 1

## 2015-07-25 MED ORDER — LACTATED RINGERS IV SOLN
INTRAVENOUS | Status: DC
  Administered 2015-07-25: 09:00:00 via INTRAVENOUS

## 2015-07-25 MED ORDER — CITRIC ACID-SODIUM CITRATE 334-500 MG/5ML PO SOLN: 30.0000 mL | ORAL | Status: DC | PRN

## 2015-07-25 MED ORDER — BENZOCAINE-MENTHOL 20-0.5 % EX AERO
1.0000 "application " | INHALATION_SPRAY | CUTANEOUS | Status: DC | PRN
  Administered 2015-07-25: 1 via TOPICAL
  Filled 2015-07-25: qty 56

## 2015-07-25 MED ORDER — OXYTOCIN 40 UNITS IN LACTATED RINGERS INFUSION - SIMPLE MED
1.0000 m[IU]/min | INTRAVENOUS | Status: DC
  Administered 2015-07-25: 2 m[IU]/min via INTRAVENOUS
  Filled 2015-07-25: qty 1000

## 2015-07-25 MED ORDER — PHENYLEPHRINE 40 MCG/ML (10ML) SYRINGE FOR IV PUSH (FOR BLOOD PRESSURE SUPPORT)
80.0000 ug | PREFILLED_SYRINGE | INTRAVENOUS | Status: DC | PRN
  Filled 2015-07-25: qty 20
  Filled 2015-07-25: qty 2

## 2015-07-25 MED ORDER — LANOLIN HYDROUS EX OINT: TOPICAL_OINTMENT | CUTANEOUS | Status: DC | PRN

## 2015-07-25 MED ORDER — DIPHENHYDRAMINE HCL 25 MG PO CAPS: 25.0000 mg | ORAL_CAPSULE | Freq: Four times a day (QID) | ORAL | Status: DC | PRN

## 2015-07-25 MED ORDER — SENNOSIDES-DOCUSATE SODIUM 8.6-50 MG PO TABS
2.0000 | ORAL_TABLET | ORAL | Status: DC
  Administered 2015-07-26 (×2): 2 via ORAL
  Filled 2015-07-25 (×2): qty 2

## 2015-07-25 MED ORDER — IBUPROFEN 600 MG PO TABS
600.0000 mg | ORAL_TABLET | Freq: Four times a day (QID) | ORAL | Status: DC
  Administered 2015-07-25 – 2015-07-27 (×8): 600 mg via ORAL
  Filled 2015-07-25 (×9): qty 1

## 2015-07-25 MED ORDER — OXYCODONE-ACETAMINOPHEN 5-325 MG PO TABS: 1.0000 | ORAL_TABLET | ORAL | Status: DC | PRN

## 2015-07-25 MED ORDER — ZOLPIDEM TARTRATE 5 MG PO TABS: 5.0000 mg | ORAL_TABLET | Freq: Every evening | ORAL | Status: DC | PRN

## 2015-07-25 MED ORDER — EPHEDRINE 5 MG/ML INJ
10.0000 mg | INTRAVENOUS | Status: DC | PRN
  Filled 2015-07-25: qty 2

## 2015-07-25 MED ORDER — DIPHENHYDRAMINE HCL 50 MG/ML IJ SOLN: 12.5000 mg | INTRAMUSCULAR | Status: DC | PRN

## 2015-07-25 MED ORDER — LACTATED RINGERS IV SOLN
500.0000 mL | INTRAVENOUS | Status: DC | PRN
  Administered 2015-07-25 (×2): 500 mL via INTRAVENOUS

## 2015-07-25 MED ORDER — LIDOCAINE HCL (PF) 1 % IJ SOLN
30.0000 mL | INTRAMUSCULAR | Status: DC | PRN
  Filled 2015-07-25: qty 30

## 2015-07-25 MED ORDER — DIBUCAINE 1 % RE OINT
1.0000 "application " | TOPICAL_OINTMENT | RECTAL | Status: DC | PRN
  Administered 2015-07-25: 1 via RECTAL
  Filled 2015-07-25: qty 28

## 2015-07-25 MED ORDER — BUTORPHANOL TARTRATE 1 MG/ML IJ SOLN
1.0000 mg | INTRAMUSCULAR | Status: DC | PRN
Start: 2015-07-25 — End: 2015-07-25

## 2015-07-25 MED ORDER — TETANUS-DIPHTH-ACELL PERTUSSIS 5-2.5-18.5 LF-MCG/0.5 IM SUSP: 0.5000 mL | Freq: Once | INTRAMUSCULAR | Status: DC

## 2015-07-25 MED ORDER — LIDOCAINE HCL (PF) 1 % IJ SOLN
INTRAMUSCULAR | Status: DC | PRN
  Administered 2015-07-25 (×2): 4 mL

## 2015-07-25 NOTE — Anesthesia Procedure Notes (Signed)
Epidural Patient location during procedure: OB  Staffing Anesthesiologist: Jo-Ann Johanning Performed by: anesthesiologist   Preanesthetic Checklist Completed: patient identified, site marked, surgical consent, pre-op evaluation, timeout performed, IV checked, risks and benefits discussed and monitors and equipment checked  Epidural Patient position: sitting Prep: site prepped and draped and DuraPrep Patient monitoring: continuous pulse ox and blood pressure Approach: midline Location: L3-L4 Injection technique: LOR saline  Needle:  Needle type: Tuohy  Needle gauge: 17 G Needle length: 9 cm and 9 Needle insertion depth: 5 cm cm Catheter type: closed end flexible Catheter size: 19 Gauge Catheter at skin depth: 10 cm Test dose: negative  Assessment Events: blood not aspirated, injection not painful, no injection resistance, negative IV test and no paresthesia  Additional Notes Patient identified. Risks/Benefits/Options discussed with patient including but not limited to bleeding, infection, nerve damage, paralysis, failed block, incomplete pain control, headache, blood pressure changes, nausea, vomiting, reactions to medication both or allergic, itching and postpartum back pain. Confirmed with bedside nurse the patient's most recent platelet count. Confirmed with patient that they are not currently taking any anticoagulation, have any bleeding history or any family history of bleeding disorders. Patient expressed understanding and wished to proceed. All questions were answered. Sterile technique was used throughout the entire procedure. Please see nursing notes for vital signs. Test dose was given through epidural catheter and negative prior to continuing to dose epidural or start infusion. Warning signs of high block given to the patient including shortness of breath, tingling/numbness in hands, complete motor block, or any concerning symptoms with instructions to call for help. Patient was  given instructions on fall risk and not to get out of bed. All questions and concerns addressed with instructions to call with any issues or inadequate analgesia.      

## 2015-07-25 NOTE — Lactation Note (Signed)
This note was copied from the chart of Karen Willey Blade. Lactation Consultation Note initial visit at 12 hours of age.  Baby is crying after a feeding of about 30 minutes, then mom changed a wet and soiled diaper, and baby calmed.  Mom reports good feedings and baby has been eager to eat often.  Mom has experience with breastfeeding older child and pumping with her 2nd. [facility] LC resources given and discussed.  Encouraged to feed with early cues on demand.  Early newborn behavior discussed.  Hand expression reported by mom with colostrum visible.  Mom to call for assist as needed.     Patient Name: Karen Norman Today's Date: 07/25/2015 Reason for consult: Initial assessment   Maternal Data Has patient been taught Hand Expression?: Yes Does the patient have breastfeeding experience prior to this delivery?: Yes  Feeding Feeding Type: Breast Fed Length of feed: 15 min  LATCH Score/Interventions                      Lactation Tools Discussed/Used     Consult Status Consult Status: Follow-up Date: 07/26/15 Follow-up type: In-patient    Jannifer Rodney 07/25/2015, 10:18 PM

## 2015-07-25 NOTE — Progress Notes (Signed)
Patient ID: Karen Norman, female   DOB: 20-Oct-1981, 34 y.o.   MRN: [id number] Pt admitted for IOL, felling minimal contractions FHR category 1 Cervix 4/70/-2 AROM clear  Pitocin begun and will follow progress FBS WNL 80's, diet controlled so will not need rechecks in labor Plans epidural

## 2015-07-25 NOTE — Anesthesia Preprocedure Evaluation (Signed)
Anesthesia Evaluation  Patient identified by MRN, date of birth, ID band Patient awake    Reviewed: Allergy & Precautions, NPO status , Patient's Chart, lab work & pertinent test results  History of Anesthesia Complications Negative for: history of anesthetic complications  Airway Mallampati: II  TM Distance: >3 FB Neck ROM: Full    Dental no notable dental hx. (+) Dental Advisory Given   Pulmonary neg pulmonary ROS,    Pulmonary exam normal breath sounds clear to auscultation       Cardiovascular negative cardio ROS Normal cardiovascular exam Rhythm:Regular Rate:Normal     Neuro/Psych negative neurological ROS  negative psych ROS   GI/Hepatic Neg liver ROS, GERD  Medicated and Controlled,  Endo/Other  diabetes, Gestational  Renal/GU negative Renal ROS  negative genitourinary   Musculoskeletal negative musculoskeletal ROS (+)   Abdominal   Peds negative pediatric ROS (+)  Hematology negative hematology ROS (+)   Anesthesia Other Findings   Reproductive/Obstetrics (+) Pregnancy                             Anesthesia Physical Anesthesia Plan  ASA: II  Anesthesia Plan: Epidural   Post-op Pain Management:    Induction:   Airway Management Planned:   Additional Equipment:   Intra-op Plan:   Post-operative Plan:   Informed Consent: I have reviewed the patients History and Physical, chart, labs and discussed the procedure including the risks, benefits and alternatives for the proposed anesthesia with the patient or authorized representative who has indicated his/her understanding and acceptance.   Dental advisory given  Plan Discussed with: CRNA  Anesthesia Plan Comments:         Anesthesia Quick Evaluation

## 2015-07-26 ENCOUNTER — Inpatient Hospital Stay (HOSPITAL_COMMUNITY)
Admission: AD | Admit: 2015-07-26 | Payer: Commercial Managed Care - PPO | Source: Ambulatory Visit | Admitting: Obstetrics and Gynecology

## 2015-07-26 LAB — CBC
HCT: 35.8 % — ABNORMAL LOW (ref 36.0–46.0)
HEMOGLOBIN: 11.8 g/dL — AB (ref 12.0–15.0)
MCH: 31.7 pg (ref 26.0–34.0)
MCHC: 33 g/dL (ref 30.0–36.0)
MCV: 96.2 fL (ref 78.0–100.0)
PLATELETS: 197 10*3/uL (ref 150–400)
RBC: 3.72 MIL/uL — AB (ref 3.87–5.11)
RDW: 14.3 % (ref 11.5–15.5)
WBC: 9.8 10*3/uL (ref 4.0–10.5)

## 2015-07-26 NOTE — Anesthesia Postprocedure Evaluation (Signed)
  Anesthesia Post-op Note  Patient: Marinell Blight  Procedure(s) Performed: * No procedures listed *  Patient Location: Mother/Baby  Anesthesia Type:Regional  Level of Consciousness: awake, alert , oriented and patient cooperative  Airway and Oxygen Therapy: Patient Spontanous Breathing  Post-op Pain: mild  Post-op Assessment: Post-op Vital signs reviewed, Patient's Cardiovascular Status Stable, Respiratory Function Stable, Patent Airway, No signs of Nausea or vomiting, Adequate PO intake, Pain level controlled, No headache, No backache and Patient able to bend at knees              Post-op Vital Signs: Reviewed and stable  Last Vitals:  Filed Vitals:   07/26/15 0606  BP: 98/61  Pulse: 54  Temp: 36.9 C  Resp: 16    Complications: No apparent anesthesia complications

## 2015-07-26 NOTE — Progress Notes (Signed)
Post Partum Day 1 Subjective: no complaints and tolerating PO  Objective: Blood pressure 98/61, pulse 54, temperature 98.4 F (36.9 C), temperature source Oral, resp. rate 16, height  (1.626 m), weight 88.905 kg (196 lb), last menstrual period 10/24/2014, SpO2 100 %, unknown if currently breastfeeding.  Physical Exam:  General: alert and cooperative Lochia: appropriate Uterine Fundus: firm   Recent Labs  07/25/15 0745 07/26/15 0543  HGB 12.9 11.8*  HCT 38.0 35.8*    Assessment/Plan: Plan for discharge tomorrow   LOS: 1 day   Karen Norman W 07/26/2015, 8:23 AM

## 2015-07-26 NOTE — Anesthesia Postprocedure Evaluation (Deleted)
  Anesthesia Post-op Note  Patient: Karen Norman  Procedure(s) Performed: * No procedures listed *  Patient Location: Mother/Baby  Anesthesia Type:Regional  Level of Consciousness: awake, alert , oriented and patient cooperative  Airway and Oxygen Therapy: Patient Spontanous Breathing  Post-op Pain: mild  Post-op Assessment: Post-op Vital signs reviewed, Patient's Cardiovascular Status Stable, Respiratory Function Stable, Patent Airway, No signs of Nausea or vomiting, Adequate PO intake, Pain level controlled, No headache, No backache and Patient able to bend at knees              Post-op Vital Signs: Reviewed and stable  Last Vitals:  Filed Vitals:   07/26/15 0606  BP: 98/61  Pulse: 54  Temp: 36.9 C  Resp: 16    Complications: No apparent anesthesia complications 

## 2015-07-27 ENCOUNTER — Ambulatory Visit: Payer: Self-pay

## 2015-07-27 NOTE — Progress Notes (Signed)
Post Partum Day 2 Subjective: no complaints, up ad lib, voiding, tolerating PO, + flatus and pain controlled. Breastfeeding well  Objective: Blood pressure 104/65, pulse 78, temperature 98.1 F (36.7 C), temperature source Oral, resp. rate 16, height  (1.626 m), weight 88.905 kg (196 lb), last menstrual period 10/24/2014, SpO2 100 %, unknown if currently breastfeeding.  Physical Exam:  General: alert, cooperative and no distress Lochia: appropriate Uterine Fundus: firm  DVT Evaluation: Negative Homan's sign. Mild edema only   Recent Labs  07/25/15 0745 07/26/15 0543  HGB 12.9 11.8*  HCT 38.0 35.8*    Assessment/Plan: Discharge home, Breastfeeding and Contraception considering minipill   LOS: 2 days   Sharol Given Banga 07/27/2015, 8:52 AM

## 2015-07-27 NOTE — Lactation Note (Signed)
This note was copied from the chart of Karen Norman. Lactation Consultation Note  Patient Name: Karen Norman [id number] Date: 07/27/2015 Reason for consult: Follow-up assessment;Breast/nipple pain;Other (Comment);Infant weight loss (7 % weight oss )  Baby is 49 hours old , 7 % weight loss, Bili check low risk , voids and stools adequate for age. Per mom baby recently breast fed at 1020  For 15 mins. Per mom and [facility] RN , sore nipples both breast. LC assessed both breast with moms permission and noted positional  Strips noted both breast and semi compressible areolas . LC reviewed basics for latching and sore nipple tx . - breast massage , hand express,  Pre-pump with hand pump to prime the milk ducts, and to enhance the base of the nipple being more elastic for a deeper latch , then reverse pressure Exercise to soften areola. Firm support and breast compressions with latch until swallows and comfort achieved. Intermittent breast compressions  With latch and intermittent. Sore nipple and engorgement prevention and tx. Reviewed. LC instructed mom on the use hand pump,shells, increased flange #27  If needed for when the milk comes in , #24 flange fit well today , LC checked size and mom was comfortable, also instructed on the use of comfort gels.  LC assessed oral cavity for frenulum issues, ( considering moms soreness), noted a short labial frenulum above the gum line, baby has good tongue mobility  Over gum line and also raises tongue upward. No indentation of mid tongue area noted. High palate and recessed chin noted. Due to moms swollen areolas  Moms tissue probably doesn't always greet the babies palate. Mom willing to come in next week Thursday ay 0900 9/22 for LC O/P apt. And apt reminder given .  Mom also mentioned she has a DEBP at home.     Maternal Data Has patient been taught Hand Expression?: Yes  Feeding Feeding Type: Breast Fed Length of feed: 15 min  LATCH  Score/Interventions Latch:  (baby recently breast fed at 1020 ) Intervention(s): Assist with latch  Audible Swallowing: Spontaneous and intermittent Intervention(s): Skin to skin;Hand expression  Type of Nipple: Everted at rest and after stimulation  Comfort (Breast/Nipple): Filling, red/small blisters or bruises, mild/mod discomfort  Problem noted: Cracked, bleeding, blisters, bruises;Mild/Moderate discomfort Interventions  (Cracked/bleeding/bruising/blister): Expressed breast milk to nipple Interventions (Mild/moderate discomfort): Hand massage  Hold (Positioning): No assistance needed to correctly position infant at breast. Intervention(s): Breastfeeding basics reviewed  LATCH Score: 9  Lactation Tools Discussed/Used Tools: Shells;Pump;Comfort gels Flange Size: 27 Shell Type: Inverted Breast pump type: Manual WIC Program: No Pump Review: Setup, frequency, and cleaning;Milk Storage Initiated by:: MAI  Date initiated:: 07/27/15   Consult Status Consult Status: Follow-up Date: 08/02/15 (9 am ) Follow-up type: Out-patient    Kathrin Greathouse 07/27/2015, 11:53 AM

## 2015-07-27 NOTE — Discharge Summary (Signed)
Obstetric Discharge Summary Reason for Admission: induction of labor Prenatal Procedures: ultrasound Intrapartum Procedures: spontaneous vaginal delivery Postpartum Procedures: none Complications-Operative and Postpartum: 2 degree perineal laceration HEMOGLOBIN  Date Value Ref Range Status  07/26/2015 11.8* 12.0 - 15.0 g/dL Final  19/14/7829 56.2 12.2 - 16.2 g/dL Final   HCT  Date Value Ref Range Status  07/26/2015 35.8* 36.0 - 46.0 % Final   HCT, POC  Date Value Ref Range Status  02/15/2013 45.7 37.7 - 47.9 % Final    Physical Exam:  General: alert, cooperative and no distress Lochia: appropriate Uterine Fundus: firm DVT Evaluation: Negative Homan's sign.  Discharge Diagnoses: Term Pregnancy-delivered  Discharge Information: Date: 07/27/2015 Activity: pelvic rest Diet: routine Medications: PNV and Ibuprofen Condition: stable Instructions: refer to practice specific booklet Discharge to: home Follow-up Information    Follow up with Karen Pila, MD. Call in 6 weeks.   Specialty:  Obstetrics and Gynecology   Why:  For postpartum visit   Contact information:   [address] [place] [address] [phone]       Newborn Data: Live born female  Birth Weight: 7 lb 7.2 oz (3379 g) APGAR: 8, 10  Home with mother.  Karen Norman 07/27/2015, 8:59 AM

## 2015-07-27 NOTE — Discharge Instructions (Signed)
Nothing in vagina for 6 weeks.  No sex, tampons, and douching.  Other instructions as in [facility] Discharge Booklet. °

## 2015-08-02 ENCOUNTER — Ambulatory Visit (HOSPITAL_COMMUNITY): Payer: Commercial Managed Care - PPO

## 2016-10-14 DIAGNOSIS — R102 Pelvic and perineal pain: Secondary | ICD-10-CM | POA: Insufficient documentation

## 2017-01-15 DIAGNOSIS — Z8632 Personal history of gestational diabetes: Secondary | ICD-10-CM | POA: Insufficient documentation

## 2017-10-08 ENCOUNTER — Other Ambulatory Visit: Payer: Self-pay | Admitting: Obstetrics and Gynecology

## 2017-10-08 DIAGNOSIS — N63 Unspecified lump in unspecified breast: Secondary | ICD-10-CM

## 2017-10-14 ENCOUNTER — Ambulatory Visit
Admission: RE | Admit: 2017-10-14 | Discharge: 2017-10-14 | Disposition: A | Discharge status: Home / Self Care | Payer: Commercial Managed Care - PPO | Source: Ambulatory Visit | Attending: Obstetrics and Gynecology | Admitting: Obstetrics and Gynecology

## 2017-10-14 DIAGNOSIS — N63 Unspecified lump in unspecified breast: Secondary | ICD-10-CM

## 2018-12-28 DIAGNOSIS — Z8742 Personal history of other diseases of the female genital tract: Secondary | ICD-10-CM | POA: Insufficient documentation

## 2019-10-13 ENCOUNTER — Other Ambulatory Visit: Payer: Self-pay

## 2019-10-13 ENCOUNTER — Encounter: Payer: Self-pay | Admitting: Adult Health

## 2019-10-13 ENCOUNTER — Ambulatory Visit: Payer: Commercial Managed Care - PPO | Admitting: Adult Health

## 2019-10-13 DIAGNOSIS — F411 Generalized anxiety disorder: Secondary | ICD-10-CM | POA: Diagnosis not present

## 2019-10-13 DIAGNOSIS — F509 Eating disorder, unspecified: Secondary | ICD-10-CM | POA: Diagnosis not present

## 2019-10-13 DIAGNOSIS — G4700 Insomnia, unspecified: Secondary | ICD-10-CM

## 2019-10-13 DIAGNOSIS — F331 Major depressive disorder, recurrent, moderate: Secondary | ICD-10-CM | POA: Diagnosis not present

## 2019-10-13 MED ORDER — FLUOXETINE HCL 20 MG PO CAPS: 20.0000 mg | ORAL_CAPSULE | Freq: Every day | ORAL | 5 refills | Status: DC

## 2019-10-13 NOTE — Progress Notes (Signed)
[facility] MD/PA/NP Initial Note  10/13/2019 2:52 PM Karen Norman  MRN:  [id number]  Chief Complaint:   HPI:   Describes mood today as "ok". Pleasant. Mood symptoms - depression, anxiety, and irritability. Has a lot of pressure on her chest. Has a lot to keep up with. Is usually a very laid back person. Stating "I internalize things". Oldest son is ADHD and is home schooled. Multiple medical issues with "special" needs. Has a lot of health needs and appointments. Daughter "into" everything. Working part time. Has a lot of responsibilities. Feels like she has a lot to do and husband not always as helpful as he could be. Getting so stressed she will spend money or overeat. Has gotten in financial trouble. No longer spending. Is having periods of "over-eating" and "binge eating". Stable interest and motivation. Taking medications as prescribed.  Energy levels lower - "tired a lot". Active, does not have a regular exercise routine. Works part-time as a Astronomer. Enjoys some usual interests and activities. Married x 14 years. Has 3 children - 2 boys and one girl - 10,8, and 4. Spending time with family.  Appetite adequate. Weight gain. Is a "stress eater". Has not been able to get weight off since last pregnancy.  Sleeps better some nights than others. Falls asleep easily. Restless. Listening out for son with special needs. Averages 6 to 7 hours. Focus and concentration stable. Completing tasks. Managing aspects of household.  Denies SI or HI.  Denies AH or VH.  Recent physical with labs - all WNL.  Visit Diagnosis:    ICD-10-CM   1. Major depressive disorder, recurrent episode, moderate (HCC)  F33.1 FLUoxetine (PROZAC) 20 MG capsule  2. Generalized anxiety disorder  F41.1 FLUoxetine (PROZAC) 20 MG capsule  3. Insomnia, unspecified type  G47.00   4. Eating disorder, unspecified type  F50.9 FLUoxetine (PROZAC) 20 MG capsule    Past Psychiatric History:Denies psychiatric  hospitalizations.   Past Medical History:  Past Medical History:  Diagnosis Date  . Endometriosis   . GERD (gastroesophageal reflux disease)    tums prn  . Gestational diabetes mellitus, antepartum   . Seasonal allergies   . SVD (spontaneous vaginal delivery)    x 2    Past Surgical History:  Procedure Laterality Date  . CERVICAL CERCLAGE N/A 01/26/2015   Procedure: CERCLAGE CERVICAL;  Surgeon: Paula Compton, MD;  Location: [facility];  Service: Gynecology;  Laterality: N/A;  . KNEE SURGERY     left  . LAPAROTOMY     left ovary  . WISDOM TOOTH EXTRACTION      Family Psychiatric History: Denies any family history of mental illness.  Family History:  Family History  Problem Relation Age of Onset  . Hypertension Father   . Heart disease Father   . Cancer Maternal Grandmother   . Breast cancer Maternal Grandmother   . Breast cancer Paternal Grandmother     Social History:  Social History   Socioeconomic History  . Marital status: Married    Spouse name: Not on file  . Number of children: Not on file  . Years of education: Not on file  . Highest education level: Not on file  Occupational History  . Not on file  Social Needs  . Financial resource strain: Not on file  . Food insecurity    Worry: Not on file    Inability: Not on file  . Transportation needs    Medical: Not on file    Non-medical:  Not on file  Tobacco Use  . Smoking status: Never Smoker  . Smokeless tobacco: Never Used  Substance and Sexual Activity  . Alcohol use: No  . Drug use: No  . Sexual activity: Yes    Birth control/protection: None    Comment: approx 11 wlks gestation as of 01/09/15  Lifestyle  . Physical activity    Days per week: Not on file    Minutes per session: Not on file  . Stress: Not on file  Relationships  . Social Musicianconnections    Talks on phone: Not on file    Gets together: Not on file    Attends religious service: Not on file    Active member of club or organization:  Not on file    Attends meetings of clubs or organizations: Not on file    Relationship status: Not on file  Other Topics Concern  . Not on file  Social History Narrative  . Not on file    Allergies: No Known Allergies  Metabolic Disorder Labs: No results found for: HGBA1C, MPG No results found for: PROLACTIN No results found for: CHOL, TRIG, HDL, CHOLHDL, VLDL, LDLCALC No results found for: TSH  Therapeutic Level Labs: No results found for: LITHIUM No results found for: VALPROATE No components found for:  CBMZ  Current Medications: Current Outpatient Medications  Medication Sig Dispense Refill  . acetaminophen (TYLENOL) 500 MG tablet Take 1,000 mg by mouth every 6 (six) hours as needed for moderate pain.    . calcium carbonate (TUMS - DOSED IN MG ELEMENTAL CALCIUM) 500 MG chewable tablet Chew 2 tablets by mouth daily as needed for indigestion or heartburn.    . cetirizine (ZYRTEC) 10 MG tablet Take 10 mg by mouth daily.    Marland Kitchen. FLUoxetine (PROZAC) 20 MG capsule Take 1 capsule (20 mg total) by mouth daily. 30 capsule 5  . phenylephrine-shark liver oil-mineral oil-petrolatum (PREPARATION H) 0.25-3-14-71.9 % rectal ointment Place 1 application rectally 2 (two) times daily as needed for hemorrhoids.    . Prenatal Vit-Fe Fumarate-FA (PRENATAL MULTIVITAMIN) TABS tablet Take 1 tablet by mouth daily at 12 noon.    . ranitidine (ZANTAC) 150 MG tablet Take 150 mg by mouth 2 (two) times daily.     No current facility-administered medications for this visit.     Medication Side Effects: none  Orders placed this visit:  No orders of the defined types were placed in this encounter.   Psychiatric Specialty Exam:  Review of Systems  Musculoskeletal: Negative for falls.  Neurological: Negative for seizures, weakness and headaches.  Psychiatric/Behavioral: Negative for depression. The patient is nervous/anxious and has insomnia.     unknown if currently breastfeeding.There is no height or  weight on file to calculate BMI.  General Appearance: Neat and Well Groomed  Eye Contact:  Good  Speech:  Clear and Coherent and Normal Rate  Volume:  Normal  Mood:  Anxious, Depressed and Irritable  Affect:  Appropriate and Congruent  Thought Process:  Coherent  Orientation:  Full (Time, Place, and Person)  Thought Content: Logical   Suicidal Thoughts:  No  Homicidal Thoughts:  No  Memory:  WNL  Judgement:  Good  Insight:  Good  Psychomotor Activity:  Normal  Concentration:  Concentration: Good  Recall:  Good  Fund of Knowledge: Good  Language: Good  Assets:  Communication Skills Desire for Improvement Financial Resources/Insurance Housing Intimacy Leisure Time Physical Health Resilience Social Support Talents/Skills Transportation Vocational/Educational  ADL's:  Intact  Cognition: WNL  Prognosis:  Good   Screenings:  PHQ2-9     Nutrition from 05/23/2015 in [facility]  PHQ-2 Total Score  0      Receiving Psychotherapy: Yes [facility]  Treatment Plan/Recommendations:   Plan:  1. D/C Lexapro 20mg  daily - 2 years - take 1/2 tab daily x 7 days then d/c 2. Add Prozac 20mg  daily  Add Melatonin at hs prn  RTC 4 weeks  Patient advised to contact office with any questions, adverse effects, or acute worsening in signs and symptoms.   , NP

## 2019-11-04 ENCOUNTER — Other Ambulatory Visit: Payer: Self-pay | Admitting: Adult Health

## 2019-11-04 DIAGNOSIS — F331 Major depressive disorder, recurrent, moderate: Secondary | ICD-10-CM

## 2019-11-04 DIAGNOSIS — F509 Eating disorder, unspecified: Secondary | ICD-10-CM

## 2019-11-04 DIAGNOSIS — F411 Generalized anxiety disorder: Secondary | ICD-10-CM

## 2019-11-10 ENCOUNTER — Ambulatory Visit (INDEPENDENT_AMBULATORY_CARE_PROVIDER_SITE_OTHER): Payer: Commercial Managed Care - PPO | Admitting: Adult Health

## 2019-11-10 ENCOUNTER — Other Ambulatory Visit: Payer: Self-pay

## 2019-11-10 ENCOUNTER — Encounter: Payer: Self-pay | Admitting: Adult Health

## 2019-11-10 DIAGNOSIS — F509 Eating disorder, unspecified: Secondary | ICD-10-CM | POA: Diagnosis not present

## 2019-11-10 DIAGNOSIS — F411 Generalized anxiety disorder: Secondary | ICD-10-CM | POA: Diagnosis not present

## 2019-11-10 DIAGNOSIS — G4700 Insomnia, unspecified: Secondary | ICD-10-CM

## 2019-11-10 DIAGNOSIS — F331 Major depressive disorder, recurrent, moderate: Secondary | ICD-10-CM | POA: Diagnosis not present

## 2019-11-10 MED ORDER — ALPRAZOLAM 0.25 MG PO TABS: 0.2500 mg | ORAL_TABLET | Freq: Every day | ORAL | 2 refills | Status: DC

## 2019-11-10 MED ORDER — FLUOXETINE HCL 40 MG PO CAPS: 40.0000 mg | ORAL_CAPSULE | Freq: Every day | ORAL | 2 refills | Status: DC

## 2019-11-10 NOTE — Progress Notes (Signed)
Karen Norman [id number] [birth year] 38 y.o.  Subjective:   Patient ID:  Karen Norman is a 38 y.o. (DOB Mar 16, 1981) female.  Chief Complaint: No chief complaint on file.  HPI Karen Norman presents to the office today for follow-up of MDD, GAD, insomnia, and eating disorder.  Describes mood today as "ok". Pleasant. Mood symptoms - reports decreased depression, anxiety, and irritability. Stating "I'm doing better over the past two days". Felt very "emotional" after stopping the Lexapro. Feels better over the past few days. Feels more energized off of Lexapro. Stating "I felt like a slug". Felt like she was walking around in a "state of exhaustion". Has had times where she has needed to "remove herself from the situation". Gets overwhelmed - "not as much". Stating "I still have a lot going on". Children are coming off their "christmas high" and starting to act up. Continues to "stress eat". Has stopped "spending" money. Improved interest and motivation. Taking medications as prescribed.  Energy levels improved. Active, does not have a regular exercise routine. Works part-time as a Human resources officer. Enjoys some usual interests and activities. Married. Lives with husband of 14 years and 3 children - 2 boys and one girl - 10, 8, and 4. Spending time with family.  Appetite adequate. Weight gain.  Sleeps better some nights than others - "sleep is ok". Getting "hot" during the night. Averages 6 to 7 hours. Focus and concentration stable. Completing tasks. Managing aspects of household. Work going well. Denies SI or HI. Denies AH or VH.  Recent physical with labs - all WNL.  PHQ2-9     Nutrition from 05/23/2015 in [facility]  PHQ-2 Total Score  0      Review of Systems:  Review of Systems  Musculoskeletal: Negative for gait problem.  Neurological: Negative for tremors.  Psychiatric/Behavioral:       Please refer to HPI    Medications: I have reviewed  the patient's current medications.  Current Outpatient Medications  Medication Sig Dispense Refill  . acetaminophen (TYLENOL) 500 MG tablet Take 1,000 mg by mouth every 6 (six) hours as needed for moderate pain.    . calcium carbonate (TUMS - DOSED IN MG ELEMENTAL CALCIUM) 500 MG chewable tablet Chew 2 tablets by mouth daily as needed for indigestion or heartburn.    . cetirizine (ZYRTEC) 10 MG tablet Take 10 mg by mouth daily.    Marland Kitchen FLUoxetine (PROZAC) 20 MG capsule TAKE 1 CAPSULE BY MOUTH EVERY DAY 90 capsule 2  . phenylephrine-shark liver oil-mineral oil-petrolatum (PREPARATION H) 0.25-3-14-71.9 % rectal ointment Place 1 application rectally 2 (two) times daily as needed for hemorrhoids.    . Prenatal Vit-Fe Fumarate-FA (PRENATAL MULTIVITAMIN) TABS tablet Take 1 tablet by mouth daily at 12 noon.    . ranitidine (ZANTAC) 150 MG tablet Take 150 mg by mouth 2 (two) times daily.     No current facility-administered medications for this visit.    Medication Side Effects: None  Allergies: No Known Allergies  Past Medical History:  Diagnosis Date  . Endometriosis   . GERD (gastroesophageal reflux disease)    tums prn  . Gestational diabetes mellitus, antepartum   . Seasonal allergies   . SVD (spontaneous vaginal delivery)    x 2    Family History  Problem Relation Age of Onset  . Hypertension Father   . Heart disease Father   . Cancer Maternal Grandmother   . Breast cancer Maternal Grandmother   .  Breast cancer Paternal Grandmother     Social History   Socioeconomic History  . Marital status: Married    Spouse name: Not on file  . Number of children: Not on file  . Years of education: Not on file  . Highest education level: Not on file  Occupational History  . Not on file  Tobacco Use  . Smoking status: Never Smoker  . Smokeless tobacco: Never Used  Substance and Sexual Activity  . Alcohol use: No  . Drug use: No  . Sexual activity: Yes    Birth control/protection:  None    Comment: approx 11 wlks gestation as of 01/09/15  Other Topics Concern  . Not on file  Social History Narrative  . Not on file   Social Determinants of Health   Financial Resource Strain:   . Difficulty of Paying Living Expenses: Not on file  Food Insecurity:   . Worried About Charity fundraiser in the Last Year: Not on file  . Ran Out of Food in the Last Year: Not on file  Transportation Needs:   . Lack of Transportation (Medical): Not on file  . Lack of Transportation (Non-Medical): Not on file  Physical Activity:   . Days of Exercise per Week: Not on file  . Minutes of Exercise per Session: Not on file  Stress:   . Feeling of Stress : Not on file  Social Connections:   . Frequency of Communication with Friends and Family: Not on file  . Frequency of Social Gatherings with Friends and Family: Not on file  . Attends Religious Services: Not on file  . Active Member of Clubs or Organizations: Not on file  . Attends Archivist Meetings: Not on file  . Marital Status: Not on file  Intimate Partner Violence:   . Fear of Current or Ex-Partner: Not on file  . Emotionally Abused: Not on file  . Physically Abused: Not on file  . Sexually Abused: Not on file    Past Medical History, Surgical history, Social history, and Family history were reviewed and updated as appropriate.   Please see review of systems for further details on the patient's review from today.   Objective:   Physical Exam:  There were no vitals taken for this visit.  Physical Exam Constitutional:      General: She is not in acute distress.    Appearance: She is well-developed.  Musculoskeletal:        General: No deformity.  Neurological:     Mental Status: She is alert and oriented to person, place, and time.     Coordination: Coordination normal.  Psychiatric:        Attention and Perception: Attention and perception normal. She does not perceive auditory or visual hallucinations.         Mood and Affect: Mood normal. Mood is not anxious or depressed. Affect is not labile, blunt, angry or inappropriate.        Speech: Speech normal.        Behavior: Behavior normal.        Thought Content: Thought content normal. Thought content is not paranoid or delusional. Thought content does not include homicidal or suicidal ideation. Thought content does not include homicidal or suicidal plan.        Cognition and Memory: Cognition and memory normal.        Judgment: Judgment normal.     Comments: Insight intact     Lab Review:  Component Value Date/Time   NA 135 07/25/2015 0745   K 3.7 07/25/2015 0745   CL 105 07/25/2015 0745   CO2 19 (L) 07/25/2015 0745   GLUCOSE 80 07/25/2015 0745   BUN 8 07/25/2015 0745   CREATININE 0.65 07/25/2015 0745   CREATININE 1.15 (H) 02/15/2013 1100   CALCIUM 8.7 (L) 07/25/2015 0745   PROT 7.6 02/15/2013 1100   ALBUMIN 4.1 02/15/2013 1100   AST 28 02/15/2013 1100   ALT 28 02/15/2013 1100   ALKPHOS 64 02/15/2013 1100   BILITOT 0.4 02/15/2013 1100   GFRNONAA >60 07/25/2015 0745   GFRAA >60 07/25/2015 0745       Component Value Date/Time   WBC 9.8 07/26/2015 0543   RBC 3.72 (L) 07/26/2015 0543   HGB 11.8 (L) 07/26/2015 0543   HCT 35.8 (L) 07/26/2015 0543   PLT 197 07/26/2015 0543   MCV 96.2 07/26/2015 0543   MCV 96.1 02/15/2013 1102   MCH 31.7 07/26/2015 0543   MCHC 33.0 07/26/2015 0543   RDW 14.3 07/26/2015 0543    No results found for: POCLITH, LITHIUM   No results found for: PHENYTOIN, PHENOBARB, VALPROATE, CBMZ   .res Assessment: Plan:    1. Increase Prozac 20mg  to 40mg  daily  2. Add Xanax 0.25mg  daily prn anxiety   Melatonin at hs prn  RTC 4 weeks  Patient advised to contact office with any questions, adverse effects, or acute worsening in signs and symptoms.  There are no diagnoses linked to this encounter.   Please see After Visit Summary for patient specific instructions.  No future appointments.  No  orders of the defined types were placed in this encounter.   -------------------------------

## 2019-12-02 ENCOUNTER — Other Ambulatory Visit: Payer: Self-pay | Admitting: Adult Health

## 2019-12-02 DIAGNOSIS — F509 Eating disorder, unspecified: Secondary | ICD-10-CM

## 2019-12-02 DIAGNOSIS — F331 Major depressive disorder, recurrent, moderate: Secondary | ICD-10-CM

## 2019-12-02 DIAGNOSIS — F411 Generalized anxiety disorder: Secondary | ICD-10-CM

## 2019-12-08 ENCOUNTER — Ambulatory Visit (INDEPENDENT_AMBULATORY_CARE_PROVIDER_SITE_OTHER): Payer: Commercial Managed Care - PPO | Admitting: Adult Health

## 2019-12-08 ENCOUNTER — Other Ambulatory Visit: Payer: Self-pay

## 2019-12-08 ENCOUNTER — Encounter: Payer: Self-pay | Admitting: Adult Health

## 2019-12-08 DIAGNOSIS — F331 Major depressive disorder, recurrent, moderate: Secondary | ICD-10-CM | POA: Diagnosis not present

## 2019-12-08 DIAGNOSIS — F509 Eating disorder, unspecified: Secondary | ICD-10-CM | POA: Diagnosis not present

## 2019-12-08 DIAGNOSIS — G4700 Insomnia, unspecified: Secondary | ICD-10-CM

## 2019-12-08 DIAGNOSIS — F411 Generalized anxiety disorder: Secondary | ICD-10-CM

## 2019-12-08 NOTE — Progress Notes (Signed)
Karen Norman [id number] 29-Feb-1981 39 y.o.  Subjective:   Patient ID:  Karen Norman is a 39 y.o. (DOB 03/27/1981) female.  Chief Complaint:  Chief Complaint  Patient presents with  . Depression  . Anxiety  . Insomnia  . Other    Eatig disorder    HPI BRADY PLANT presents to the office today for follow-up of MDD, GAD, insomnia, and eating disorder.  Describes mood today as "ok". Pleasant. Mood symptoms - reports decreased depression, anxiety, and irritability. Stating "I'm feeling much better". Has "adjusted" to the Prozac. Feels more "energetic - "getting things done". Not feeling as "overwhlemed". Able to get more accomplished. Has not been "stress eating". Improved interest and motivation. Taking medications as prescribed.  Energy levels stable. Active, does not have a regular exercise routine. Works part-time as a Human resources officer. Enjoys some usual interests and activities. Married. Lives with husband of 14 years and 3 children - 2 boys and one girl - 10, 8, and 4. Spending time with family.  Appetite adequate. Weight gain.  Sleeps well most nights. Averages 6 to 7 hours. Focus and concentration stable. Completing tasks. Managing aspects of household. Work going well. Denies SI or HI. Denies AH or VH.  Recent physical with labs - all WNL.    PHQ2-9     Nutrition from 05/23/2015 in [facility]  PHQ-2 Total Score  0       Review of Systems:  Review of Systems  Musculoskeletal: Negative for gait problem.  Neurological: Negative for tremors.  Psychiatric/Behavioral:       Please refer to HPI    Medications: I have reviewed the patient's current medications.  Current Outpatient Medications  Medication Sig Dispense Refill  . acetaminophen (TYLENOL) 500 MG tablet Take 1,000 mg by mouth every 6 (six) hours as needed for moderate pain.    Marland Kitchen ALPRAZolam (XANAX) 0.25 MG tablet Take 1 tablet (0.25 mg total) by mouth daily. 30 tablet  2  . calcium carbonate (TUMS - DOSED IN MG ELEMENTAL CALCIUM) 500 MG chewable tablet Chew 2 tablets by mouth daily as needed for indigestion or heartburn.    . cetirizine (ZYRTEC) 10 MG tablet Take 10 mg by mouth daily.    Marland Kitchen FLUoxetine (PROZAC) 40 MG capsule TAKE 1 CAPSULE BY MOUTH EVERY DAY 90 capsule 1  . guaiFENesin-codeine (ROBITUSSIN AC) 100-10 MG/5ML syrup Iophen C-NR 10 mg-100 mg/5 mL oral liquid    . omeprazole (PRILOSEC) 20 MG capsule omeprazole 20 mg capsule,delayed release    . phenylephrine-shark liver oil-mineral oil-petrolatum (PREPARATION H) 0.25-3-14-71.9 % rectal ointment Place 1 application rectally 2 (two) times daily as needed for hemorrhoids.    . Prenatal Vit-Fe Fumarate-FA (PRENATAL MULTIVITAMIN) TABS tablet Take 1 tablet by mouth daily at 12 noon.    . ranitidine (ZANTAC) 150 MG tablet Take 150 mg by mouth 2 (two) times daily.     No current facility-administered medications for this visit.    Medication Side Effects: None  Allergies: No Known Allergies  Past Medical History:  Diagnosis Date  . Endometriosis   . GERD (gastroesophageal reflux disease)    tums prn  . Gestational diabetes mellitus, antepartum   . Seasonal allergies   . SVD (spontaneous vaginal delivery)    x 2    Family History  Problem Relation Age of Onset  . Hypertension Father   . Heart disease Father   . Cancer Maternal Grandmother   . Breast cancer Maternal Grandmother   .  Breast cancer Paternal Grandmother     Social History   Socioeconomic History  . Marital status: Married    Spouse name: Not on file  . Number of children: Not on file  . Years of education: Not on file  . Highest education level: Not on file  Occupational History  . Not on file  Tobacco Use  . Smoking status: Never Smoker  . Smokeless tobacco: Never Used  Substance and Sexual Activity  . Alcohol use: No  . Drug use: No  . Sexual activity: Yes    Birth control/protection: None    Comment: approx 11  wlks gestation as of 01/09/15  Other Topics Concern  . Not on file  Social History Narrative  . Not on file   Social Determinants of Health   Financial Resource Strain:   . Difficulty of Paying Living Expenses: Not on file  Food Insecurity:   . Worried About Charity fundraiser in the Last Year: Not on file  . Ran Out of Food in the Last Year: Not on file  Transportation Needs:   . Lack of Transportation (Medical): Not on file  . Lack of Transportation (Non-Medical): Not on file  Physical Activity:   . Days of Exercise per Week: Not on file  . Minutes of Exercise per Session: Not on file  Stress:   . Feeling of Stress : Not on file  Social Connections:   . Frequency of Communication with Friends and Family: Not on file  . Frequency of Social Gatherings with Friends and Family: Not on file  . Attends Religious Services: Not on file  . Active Member of Clubs or Organizations: Not on file  . Attends Archivist Meetings: Not on file  . Marital Status: Not on file  Intimate Partner Violence:   . Fear of Current or Ex-Partner: Not on file  . Emotionally Abused: Not on file  . Physically Abused: Not on file  . Sexually Abused: Not on file    Past Medical History, Surgical history, Social history, and Family history were reviewed and updated as appropriate.   Please see review of systems for further details on the patient's review from today.   Objective:   Physical Exam:  There were no vitals taken for this visit.  Physical Exam Constitutional:      General: She is not in acute distress.    Appearance: She is well-developed.  Musculoskeletal:        General: No deformity.  Neurological:     Mental Status: She is alert and oriented to person, place, and time.     Coordination: Coordination normal.  Psychiatric:        Attention and Perception: Attention and perception normal. She does not perceive auditory or visual hallucinations.        Mood and Affect: Mood  normal. Mood is not anxious or depressed. Affect is not labile, blunt, angry or inappropriate.        Speech: Speech normal.        Behavior: Behavior normal.        Thought Content: Thought content normal. Thought content is not paranoid or delusional. Thought content does not include homicidal or suicidal ideation. Thought content does not include homicidal or suicidal plan.        Cognition and Memory: Cognition and memory normal.        Judgment: Judgment normal.     Comments: Insight intact     Lab Review:  Component Value Date/Time   NA 135 07/25/2015 0745   K 3.7 07/25/2015 0745   CL 105 07/25/2015 0745   CO2 19 (L) 07/25/2015 0745   GLUCOSE 80 07/25/2015 0745   BUN 8 07/25/2015 0745   CREATININE 0.65 07/25/2015 0745   CREATININE 1.15 (H) 02/15/2013 1100   CALCIUM 8.7 (L) 07/25/2015 0745   PROT 7.6 02/15/2013 1100   ALBUMIN 4.1 02/15/2013 1100   AST 28 02/15/2013 1100   ALT 28 02/15/2013 1100   ALKPHOS 64 02/15/2013 1100   BILITOT 0.4 02/15/2013 1100   GFRNONAA >60 07/25/2015 0745   GFRAA >60 07/25/2015 0745       Component Value Date/Time   WBC 9.8 07/26/2015 0543   RBC 3.72 (L) 07/26/2015 0543   HGB 11.8 (L) 07/26/2015 0543   HCT 35.8 (L) 07/26/2015 0543   PLT 197 07/26/2015 0543   MCV 96.2 07/26/2015 0543   MCV 96.1 02/15/2013 1102   MCH 31.7 07/26/2015 0543   MCHC 33.0 07/26/2015 0543   RDW 14.3 07/26/2015 0543    No results found for: POCLITH, LITHIUM   No results found for: PHENYTOIN, PHENOBARB, VALPROATE, CBMZ   .res Assessment: Plan:    1. Continue Prozac 40mg  daily  2. Continue Xanax 0.25mg  daily prn anxiety   Melatonin at hs prn  RTC 3 months  Patient advised to contact office with any questions, adverse effects, or acute worsening in signs and symptoms.   Ilani was seen today for depression, anxiety, insomnia and other.  Diagnoses and all orders for this visit:  Major depressive disorder, recurrent episode, moderate  (HCC)  Generalized anxiety disorder  Insomnia, unspecified type  Eating disorder, unspecified type     Please see After Visit Summary for patient specific instructions.  Future Appointments  Date Time Provider Department Center  03/07/2020 12:00 PM Dayson Aboud, 03/09/2020, NP [facility] None    No orders of the defined types were placed in this encounter.   -------------------------------

## 2020-01-16 ENCOUNTER — Telehealth: Payer: Self-pay | Admitting: Adult Health

## 2020-01-16 ENCOUNTER — Other Ambulatory Visit: Payer: Self-pay | Admitting: Adult Health

## 2020-01-16 DIAGNOSIS — F331 Major depressive disorder, recurrent, moderate: Secondary | ICD-10-CM

## 2020-01-16 DIAGNOSIS — F509 Eating disorder, unspecified: Secondary | ICD-10-CM

## 2020-01-16 DIAGNOSIS — F411 Generalized anxiety disorder: Secondary | ICD-10-CM

## 2020-01-16 MED ORDER — FLUOXETINE HCL 20 MG PO CAPS: ORAL_CAPSULE | ORAL | 2 refills | Status: DC

## 2020-01-16 NOTE — Telephone Encounter (Signed)
Pt stated she discussed a possible dosage increase on her Prozac.She would like to go forward with the increase before her appt on 4/28.

## 2020-02-14 ENCOUNTER — Other Ambulatory Visit: Payer: Self-pay | Admitting: Adult Health

## 2020-02-14 DIAGNOSIS — F331 Major depressive disorder, recurrent, moderate: Secondary | ICD-10-CM

## 2020-02-14 DIAGNOSIS — F411 Generalized anxiety disorder: Secondary | ICD-10-CM

## 2020-02-14 DIAGNOSIS — F509 Eating disorder, unspecified: Secondary | ICD-10-CM

## 2020-03-07 ENCOUNTER — Other Ambulatory Visit: Payer: Self-pay

## 2020-03-07 ENCOUNTER — Encounter: Payer: Self-pay | Admitting: Adult Health

## 2020-03-07 ENCOUNTER — Ambulatory Visit (INDEPENDENT_AMBULATORY_CARE_PROVIDER_SITE_OTHER): Payer: Commercial Managed Care - PPO | Admitting: Adult Health

## 2020-03-07 DIAGNOSIS — F509 Eating disorder, unspecified: Secondary | ICD-10-CM | POA: Diagnosis not present

## 2020-03-07 DIAGNOSIS — G4700 Insomnia, unspecified: Secondary | ICD-10-CM

## 2020-03-07 DIAGNOSIS — F411 Generalized anxiety disorder: Secondary | ICD-10-CM

## 2020-03-07 DIAGNOSIS — F331 Major depressive disorder, recurrent, moderate: Secondary | ICD-10-CM | POA: Diagnosis not present

## 2020-03-07 MED ORDER — ALPRAZOLAM 0.25 MG PO TABS: 0.2500 mg | ORAL_TABLET | Freq: Every day | ORAL | 2 refills | Status: DC

## 2020-03-07 NOTE — Progress Notes (Signed)
Karen Norman [id number] February 22, 1981 39 y.o.  Subjective:   Patient ID:  Karen Norman is a 39 y.o. (DOB May 27, 1981) female.  Chief Complaint: No chief complaint on file.   HPI Karen Norman presents to the office today for follow-up of MDD, GAD, insomnia, and eating disorder.  Describes mood today as "ok". Pleasant. Mood symptoms - denies depression, anxiety, and irritability. Getting overwhelmed at times. Stating "I'm doing alright". Feels like Prozac continues to work well for her. Denies "stress eating" - "doing better with that". Improved interest and motivation. Taking medications as prescribed.  Energy levels stable. Active, does not have a regular exercise routine. Works part-time as a Astronomer. Enjoys some usual interests and activities. Married. Lives with husband of 14 years and 3 children - 2 boys and one girl - 91, 87, and 4. Spending time with family.  Appetite adequate. Weight loss - 26 pounds since January.   Sleeps well most nights. Averages 6 to 7 hours. Up and down with son "some" nights. Focus and concentration stable. Completing tasks. Managing aspects of household. Work going well. Denies SI or HI. Denies AH or VH.  Recent physical with labs - all WNL.   PHQ2-9     Nutrition from 05/23/2015 in [facility]  PHQ-2 Total Score  0       Review of Systems:  Review of Systems  Musculoskeletal: Negative for gait problem.  Neurological: Negative for tremors.  Psychiatric/Behavioral:       Please refer to HPI    Medications: I have reviewed the patient's current medications.  Current Outpatient Medications  Medication Sig Dispense Refill  . acetaminophen (TYLENOL) 500 MG tablet Take 1,000 mg by mouth every 6 (six) hours as needed for moderate pain.    Marland Kitchen ALPRAZolam (XANAX) 0.25 MG tablet Take 1 tablet (0.25 mg total) by mouth daily. 30 tablet 2  . calcium carbonate (TUMS - DOSED IN MG ELEMENTAL CALCIUM) 500 MG  chewable tablet Chew 2 tablets by mouth daily as needed for indigestion or heartburn.    . cetirizine (ZYRTEC) 10 MG tablet Take 10 mg by mouth daily.    Marland Kitchen ESTARYLLA 0.25-35 MG-MCG tablet Take 1 tablet by mouth daily.    Marland Kitchen FLUoxetine (PROZAC) 20 MG capsule TAKE 3 CAPSULES BY MOUTH EVERY DAY 270 capsule 1  . guaiFENesin-codeine (ROBITUSSIN AC) 100-10 MG/5ML syrup Iophen C-NR 10 mg-100 mg/5 mL oral liquid    . omeprazole (PRILOSEC) 40 MG capsule Take 40 mg by mouth 2 (two) times daily.    . phenylephrine-shark liver oil-mineral oil-petrolatum (PREPARATION H) 0.25-3-14-71.9 % rectal ointment Place 1 application rectally 2 (two) times daily as needed for hemorrhoids.    . Prenatal Vit-Fe Fumarate-FA (PRENATAL MULTIVITAMIN) TABS tablet Take 1 tablet by mouth daily at 12 noon.    . ranitidine (ZANTAC) 150 MG tablet Take 150 mg by mouth 2 (two) times daily.     No current facility-administered medications for this visit.    Medication Side Effects: None  Allergies: No Known Allergies  Past Medical History:  Diagnosis Date  . Endometriosis   . GERD (gastroesophageal reflux disease)    tums prn  . Gestational diabetes mellitus, antepartum   . Seasonal allergies   . SVD (spontaneous vaginal delivery)    x 2    Family History  Problem Relation Age of Onset  . Hypertension Father   . Heart disease Father   . Cancer Maternal Grandmother   . Breast cancer  Maternal Grandmother   . Breast cancer Paternal Grandmother     Social History   Socioeconomic History  . Marital status: Married    Spouse name: Not on file  . Number of children: Not on file  . Years of education: Not on file  . Highest education level: Not on file  Occupational History  . Not on file  Tobacco Use  . Smoking status: Never Smoker  . Smokeless tobacco: Never Used  Substance and Sexual Activity  . Alcohol use: No  . Drug use: No  . Sexual activity: Yes    Birth control/protection: None    Comment: approx 11  wlks gestation as of 01/09/15  Other Topics Concern  . Not on file  Social History Narrative  . Not on file   Social Determinants of Health   Financial Resource Strain:   . Difficulty of Paying Living Expenses:   Food Insecurity:   . Worried About Programme researcher, broadcasting/film/video in the Last Year:   . Barista in the Last Year:   Transportation Needs:   . Freight forwarder (Medical):   Marland Kitchen Lack of Transportation (Non-Medical):   Physical Activity:   . Days of Exercise per Week:   . Minutes of Exercise per Session:   Stress:   . Feeling of Stress :   Social Connections:   . Frequency of Communication with Friends and Family:   . Frequency of Social Gatherings with Friends and Family:   . Attends Religious Services:   . Active Member of Clubs or Organizations:   . Attends Banker Meetings:   Marland Kitchen Marital Status:   Intimate Partner Violence:   . Fear of Current or Ex-Partner:   . Emotionally Abused:   Marland Kitchen Physically Abused:   . Sexually Abused:     Past Medical History, Surgical history, Social history, and Family history were reviewed and updated as appropriate.   Please see review of systems for further details on the patient's review from today.   Objective:   Physical Exam:  There were no vitals taken for this visit.  Physical Exam Constitutional:      General: She is not in acute distress. Musculoskeletal:        General: No deformity.  Neurological:     Mental Status: She is alert and oriented to person, place, and time.     Coordination: Coordination normal.  Psychiatric:        Attention and Perception: Attention and perception normal. She does not perceive auditory or visual hallucinations.        Mood and Affect: Mood normal. Mood is not anxious or depressed. Affect is not labile, blunt, angry or inappropriate.        Speech: Speech normal.        Behavior: Behavior normal.        Thought Content: Thought content normal. Thought content is not  paranoid or delusional. Thought content does not include homicidal or suicidal ideation. Thought content does not include homicidal or suicidal plan.        Cognition and Memory: Cognition and memory normal.        Judgment: Judgment normal.     Comments: Insight intact     Lab Review:     Component Value Date/Time   NA 135 07/25/2015 0745   K 3.7 07/25/2015 0745   CL 105 07/25/2015 0745   CO2 19 (L) 07/25/2015 0745   GLUCOSE 80 07/25/2015 0745  BUN 8 07/25/2015 0745   CREATININE 0.65 07/25/2015 0745   CREATININE 1.15 (H) 02/15/2013 1100   CALCIUM 8.7 (L) 07/25/2015 0745   PROT 7.6 02/15/2013 1100   ALBUMIN 4.1 02/15/2013 1100   AST 28 02/15/2013 1100   ALT 28 02/15/2013 1100   ALKPHOS 64 02/15/2013 1100   BILITOT 0.4 02/15/2013 1100   GFRNONAA >60 07/25/2015 0745   GFRAA >60 07/25/2015 0745       Component Value Date/Time   WBC 9.8 07/26/2015 0543   RBC 3.72 (L) 07/26/2015 0543   HGB 11.8 (L) 07/26/2015 0543   HCT 35.8 (L) 07/26/2015 0543   PLT 197 07/26/2015 0543   MCV 96.2 07/26/2015 0543   MCV 96.1 02/15/2013 1102   MCH 31.7 07/26/2015 0543   MCHC 33.0 07/26/2015 0543   RDW 14.3 07/26/2015 0543    No results found for: POCLITH, LITHIUM   No results found for: PHENYTOIN, PHENOBARB, VALPROATE, CBMZ   .res Assessment: Plan:    1. Continue Prozac 60mg  daily  2. Continue Xanax 0.25mg  daily prn anxiety   Melatonin at hs prn  RTC 6 months  Patient advised to contact office with any questions, adverse effects, or acute worsening in signs and symptoms.  Discussed potential benefits, risk, and side effects of benzodiazepines to include potential risk of tolerance and dependence, as well as possible drowsiness.  Advised patient not to drive if experiencing drowsiness and to take lowest possible effective dose to minimize risk of dependence and tolerance.  Diagnoses and all orders for this visit:  Insomnia, unspecified type  Major depressive disorder,  recurrent episode, moderate (HCC)  Generalized anxiety disorder -     ALPRAZolam (XANAX) 0.25 MG tablet; Take 1 tablet (0.25 mg total) by mouth daily.  Eating disorder, unspecified type     Please see After Visit Summary for patient specific instructions.  No future appointments.  No orders of the defined types were placed in this encounter.   -------------------------------

## 2020-08-02 DIAGNOSIS — R768 Other specified abnormal immunological findings in serum: Secondary | ICD-10-CM | POA: Insufficient documentation

## 2020-08-02 DIAGNOSIS — L508 Other urticaria: Secondary | ICD-10-CM | POA: Insufficient documentation

## 2020-09-06 ENCOUNTER — Other Ambulatory Visit: Payer: Self-pay

## 2020-09-06 ENCOUNTER — Ambulatory Visit (INDEPENDENT_AMBULATORY_CARE_PROVIDER_SITE_OTHER): Payer: Commercial Managed Care - PPO | Admitting: Adult Health

## 2020-09-06 ENCOUNTER — Encounter: Payer: Self-pay | Admitting: Adult Health

## 2020-09-06 DIAGNOSIS — F411 Generalized anxiety disorder: Secondary | ICD-10-CM | POA: Diagnosis not present

## 2020-09-06 DIAGNOSIS — G4700 Insomnia, unspecified: Secondary | ICD-10-CM

## 2020-09-06 DIAGNOSIS — F331 Major depressive disorder, recurrent, moderate: Secondary | ICD-10-CM | POA: Diagnosis not present

## 2020-09-06 DIAGNOSIS — F509 Eating disorder, unspecified: Secondary | ICD-10-CM | POA: Diagnosis not present

## 2020-09-06 MED ORDER — LISDEXAMFETAMINE DIMESYLATE 30 MG PO CAPS: 30.0000 mg | ORAL_CAPSULE | Freq: Every day | ORAL | 0 refills | Status: DC

## 2020-09-06 MED ORDER — FLUOXETINE HCL 20 MG PO CAPS: ORAL_CAPSULE | ORAL | 1 refills | Status: DC

## 2020-09-06 MED ORDER — ALPRAZOLAM 0.25 MG PO TABS: 0.2500 mg | ORAL_TABLET | Freq: Every day | ORAL | 2 refills | Status: DC

## 2020-09-06 NOTE — Progress Notes (Signed)
Karen Norman [id number] 06-22-1981 39 y.o.  Subjective:   Patient ID:  Karen Norman is a 39 y.o. (DOB 10-Mar-1981) female.  Chief Complaint: No chief complaint on file.   HPI Karen MURALLES presents to the office today for follow-up of MDD, GAD, insomnia, and eating disorder.  Describes mood today as "ok". Pleasant. Mood symptoms - reports some denies depression, anxiety, and irritability. More irritated overall. Perfectionist, but not able to be at her norm. Stating "I'm barely getting by and I don't like that". Getting overwhelmed at times. Always on the go. Has a lot going on and is always on the run. Has started to say no to people. Feels like she is at "capacity". Reports increased binge eating - weight gain. Improved interest and motivation. Taking medications as prescribed.  Energy levels lower. Active, does not have a regular exercise routine. Works part-time as a Human resources officer. Enjoys some usual interests and activities. Married. Lives with husband of 14 years and 3 children - 2 boys and one girl - 10, 8, and 4. Spending time with family.  Appetite adequate. Weight gain. Sleeps well most nights. Averages 6 to 7 hours. Able to fall asleep, but waking up a lot.  Focus and concentration stable. Completing tasks. Managing aspects of household. Work going well. Denies SI or HI. Denies AH or VH.  Recent physical with labs - increased sed rate.    PHQ2-9     Nutrition from 05/23/2015 in [facility]  PHQ-2 Total Score 0       Review of Systems:  Review of Systems  Musculoskeletal: Negative for gait problem.  Neurological: Negative for tremors.  Psychiatric/Behavioral:       Please refer to HPI    Medications: I have reviewed the patient's current medications.  Current Outpatient Medications  Medication Sig Dispense Refill  . acetaminophen (TYLENOL) 500 MG tablet Take 1,000 mg by mouth every 6 (six) hours as needed for moderate  pain.    Marland Kitchen ALPRAZolam (XANAX) 0.25 MG tablet Take 1 tablet (0.25 mg total) by mouth daily. 30 tablet 2  . calcium carbonate (TUMS - DOSED IN MG ELEMENTAL CALCIUM) 500 MG chewable tablet Chew 2 tablets by mouth daily as needed for indigestion or heartburn.    . cetirizine (ZYRTEC) 10 MG tablet Take 10 mg by mouth daily.    Marland Kitchen ESTARYLLA 0.25-35 MG-MCG tablet Take 1 tablet by mouth daily.    Marland Kitchen FLUoxetine (PROZAC) 20 MG capsule TAKE 3 CAPSULES BY MOUTH EVERY DAY 270 capsule 1  . guaiFENesin-codeine (ROBITUSSIN AC) 100-10 MG/5ML syrup Iophen C-NR 10 mg-100 mg/5 mL oral liquid    . lisdexamfetamine (VYVANSE) 30 MG capsule Take 1 capsule (30 mg total) by mouth daily. 30 capsule 0  . omeprazole (PRILOSEC) 40 MG capsule Take 40 mg by mouth 2 (two) times daily.    . phenylephrine-shark liver oil-mineral oil-petrolatum (PREPARATION H) 0.25-3-14-71.9 % rectal ointment Place 1 application rectally 2 (two) times daily as needed for hemorrhoids.    . Prenatal Vit-Fe Fumarate-FA (PRENATAL MULTIVITAMIN) TABS tablet Take 1 tablet by mouth daily at 12 noon.    . ranitidine (ZANTAC) 150 MG tablet Take 150 mg by mouth 2 (two) times daily.     No current facility-administered medications for this visit.    Medication Side Effects: None  Allergies: No Known Allergies  Past Medical History:  Diagnosis Date  . Endometriosis   . GERD (gastroesophageal reflux disease)    tums prn  .  Gestational diabetes mellitus, antepartum   . Seasonal allergies   . SVD (spontaneous vaginal delivery)    x 2    Family History  Problem Relation Age of Onset  . Hypertension Father   . Heart disease Father   . Cancer Maternal Grandmother   . Breast cancer Maternal Grandmother   . Breast cancer Paternal Grandmother     Social History   Socioeconomic History  . Marital status: Married    Spouse name: Not on file  . Number of children: Not on file  . Years of education: Not on file  . Highest education level: Not on  file  Occupational History  . Not on file  Tobacco Use  . Smoking status: Never Smoker  . Smokeless tobacco: Never Used  Substance and Sexual Activity  . Alcohol use: No  . Drug use: No  . Sexual activity: Yes    Birth control/protection: None    Comment: approx 11 wlks gestation as of 01/09/15  Other Topics Concern  . Not on file  Social History Narrative  . Not on file   Social Determinants of Health   Financial Resource Strain:   . Difficulty of Paying Living Expenses: Not on file  Food Insecurity:   . Worried About Programme researcher, broadcasting/film/video in the Last Year: Not on file  . Ran Out of Food in the Last Year: Not on file  Transportation Needs:   . Lack of Transportation (Medical): Not on file  . Lack of Transportation (Non-Medical): Not on file  Physical Activity:   . Days of Exercise per Week: Not on file  . Minutes of Exercise per Session: Not on file  Stress:   . Feeling of Stress : Not on file  Social Connections:   . Frequency of Communication with Friends and Family: Not on file  . Frequency of Social Gatherings with Friends and Family: Not on file  . Attends Religious Services: Not on file  . Active Member of Clubs or Organizations: Not on file  . Attends Banker Meetings: Not on file  . Marital Status: Not on file  Intimate Partner Violence:   . Fear of Current or Ex-Partner: Not on file  . Emotionally Abused: Not on file  . Physically Abused: Not on file  . Sexually Abused: Not on file    Past Medical History, Surgical history, Social history, and Family history were reviewed and updated as appropriate.   Please see review of systems for further details on the patient's review from today.   Objective:   Physical Exam:  There were no vitals taken for this visit.  Physical Exam Constitutional:      General: She is not in acute distress. Musculoskeletal:        General: No deformity.  Neurological:     Mental Status: She is alert and oriented  to person, place, and time.     Coordination: Coordination normal.  Psychiatric:        Attention and Perception: Attention and perception normal. She does not perceive auditory or visual hallucinations.        Mood and Affect: Mood normal. Mood is not anxious or depressed. Affect is not labile, blunt, angry or inappropriate.        Speech: Speech normal.        Behavior: Behavior normal.        Thought Content: Thought content normal. Thought content is not paranoid or delusional. Thought content does not include homicidal  or suicidal ideation. Thought content does not include homicidal or suicidal plan.        Cognition and Memory: Cognition and memory normal.        Judgment: Judgment normal.     Comments: Insight intact     Lab Review:     Component Value Date/Time   NA 135 07/25/2015 0745   K 3.7 07/25/2015 0745   CL 105 07/25/2015 0745   CO2 19 (L) 07/25/2015 0745   GLUCOSE 80 07/25/2015 0745   BUN 8 07/25/2015 0745   CREATININE 0.65 07/25/2015 0745   CREATININE 1.15 (H) 02/15/2013 1100   CALCIUM 8.7 (L) 07/25/2015 0745   PROT 7.6 02/15/2013 1100   ALBUMIN 4.1 02/15/2013 1100   AST 28 02/15/2013 1100   ALT 28 02/15/2013 1100   ALKPHOS 64 02/15/2013 1100   BILITOT 0.4 02/15/2013 1100   GFRNONAA >60 07/25/2015 0745   GFRAA >60 07/25/2015 0745       Component Value Date/Time   WBC 9.8 07/26/2015 0543   RBC 3.72 (L) 07/26/2015 0543   HGB 11.8 (L) 07/26/2015 0543   HCT 35.8 (L) 07/26/2015 0543   PLT 197 07/26/2015 0543   MCV 96.2 07/26/2015 0543   MCV 96.1 02/15/2013 1102   MCH 31.7 07/26/2015 0543   MCHC 33.0 07/26/2015 0543   RDW 14.3 07/26/2015 0543    No results found for: POCLITH, LITHIUM   No results found for: PHENYTOIN, PHENOBARB, VALPROATE, CBMZ   .res Assessment: Plan:    1. Continue Prozac 60mg  daily  2. Continue Xanax 0.25mg  daily prn anxiety  3. Add Vyvanse 30mg  daily for binge eating disorder  Melatonin at hs prn  119/82/88  RTC 6  months  Patient advised to contact office with any questions, adverse effects, or acute worsening in signs and symptoms.  Discussed potential benefits, risk, and side effects of benzodiazepines to include potential risk of tolerance and dependence, as well as possible drowsiness.  Advised patient not to drive if experiencing drowsiness and to take lowest possible effective dose to minimize risk of dependence and tolerance.  Discussed potential benefits, risks, and side effects of stimulants with patient to include increased heart rate, palpitations, insomnia, increased anxiety, increased irritability, or decreased appetite.  Instructed patient to contact office if experiencing any significant tolerability issues.   Diagnoses and all orders for this visit:  Insomnia, unspecified type  Major depressive disorder, recurrent episode, moderate (HCC) -     FLUoxetine (PROZAC) 20 MG capsule; TAKE 3 CAPSULES BY MOUTH EVERY DAY  Generalized anxiety disorder -     FLUoxetine (PROZAC) 20 MG capsule; TAKE 3 CAPSULES BY MOUTH EVERY DAY -     ALPRAZolam (XANAX) 0.25 MG tablet; Take 1 tablet (0.25 mg total) by mouth daily.  Eating disorder, unspecified type -     FLUoxetine (PROZAC) 20 MG capsule; TAKE 3 CAPSULES BY MOUTH EVERY DAY -     lisdexamfetamine (VYVANSE) 30 MG capsule; Take 1 capsule (30 mg total) by mouth daily.     Please see After Visit Summary for patient specific instructions.  Future Appointments  Date Time Provider Department Center  10/01/2020  1:40 PM Tauri Ethington, 06-14-1986, NP [facility] None    No orders of the defined types were placed in this encounter.   -------------------------------

## 2020-10-01 ENCOUNTER — Encounter: Payer: Self-pay | Admitting: Adult Health

## 2020-10-01 ENCOUNTER — Other Ambulatory Visit: Payer: Self-pay

## 2020-10-01 ENCOUNTER — Ambulatory Visit (INDEPENDENT_AMBULATORY_CARE_PROVIDER_SITE_OTHER): Payer: Commercial Managed Care - PPO | Admitting: Adult Health

## 2020-10-01 DIAGNOSIS — F331 Major depressive disorder, recurrent, moderate: Secondary | ICD-10-CM | POA: Diagnosis not present

## 2020-10-01 DIAGNOSIS — F509 Eating disorder, unspecified: Secondary | ICD-10-CM | POA: Diagnosis not present

## 2020-10-01 DIAGNOSIS — G4700 Insomnia, unspecified: Secondary | ICD-10-CM

## 2020-10-01 DIAGNOSIS — F411 Generalized anxiety disorder: Secondary | ICD-10-CM | POA: Diagnosis not present

## 2020-10-01 MED ORDER — LISDEXAMFETAMINE DIMESYLATE 40 MG PO CAPS: 40.0000 mg | ORAL_CAPSULE | Freq: Every day | ORAL | 0 refills | Status: DC

## 2020-10-01 MED ORDER — LISDEXAMFETAMINE DIMESYLATE 40 MG PO CAPS: 40.0000 mg | ORAL_CAPSULE | ORAL | 0 refills | Status: DC

## 2020-10-01 NOTE — Progress Notes (Signed)
Karen Norman [id number] Mar 17, 1981 39 y.o.  Subjective:   Patient ID:  Karen Norman is a 39 y.o. (DOB Dec 18, 1980) female.  Chief Complaint: No chief complaint on file.   HPI Karen Norman presents to the office today for follow-up of MDD, GAD, insomnia, and eating disorder.  Describes mood today as "ok". Pleasant. Mood symptoms - reports some depression, anxiety, and irritability. Has gotten "irritated" a few times - usually in the evenings. Not getting as overwhelmed. Is able to organize things in her brain better. Reports decreased binge eating with addition of Vyvanse. Improved interest and motivation. Taking medications as prescribed.  Energy levels "a lot better". Active, does not have a regular exercise routine - "but I feel like I could".  Enjoys some usual interests and activities. Married. Lives with husband of 14 years and 3 children - 2 boys and one girl - 10, 8, and 4. Spending time with family.  Appetite adequate. Weight loss - clothes fitting differently. Sleeps well most nights. Averages 6 to 8 hours.  Focus and concentration stable. Completing tasks. Managing aspects of household. Work going well. Works part-time as a Human resources officer. Denies SI or HI. Denies AH or VH.   PHQ2-9     Nutrition from 05/23/2015 in [facility]  PHQ-2 Total Score 0       Review of Systems:  Review of Systems  Musculoskeletal: Negative for gait problem.  Neurological: Negative for tremors.  Psychiatric/Behavioral:       Please refer to HPI    Medications: I have reviewed the patient's current medications.  Current Outpatient Medications  Medication Sig Dispense Refill   acetaminophen (TYLENOL) 500 MG tablet Take 1,000 mg by mouth every 6 (six) hours as needed for moderate pain.     ALPRAZolam (XANAX) 0.25 MG tablet Take 1 tablet (0.25 mg total) by mouth daily. 30 tablet 2   calcium carbonate (TUMS - DOSED IN MG ELEMENTAL CALCIUM) 500 MG  chewable tablet Chew 2 tablets by mouth daily as needed for indigestion or heartburn.     cetirizine (ZYRTEC) 10 MG tablet Take 10 mg by mouth daily.     ESTARYLLA 0.25-35 MG-MCG tablet Take 1 tablet by mouth daily.     FLUoxetine (PROZAC) 20 MG capsule TAKE 3 CAPSULES BY MOUTH EVERY DAY 270 capsule 1   guaiFENesin-codeine (ROBITUSSIN AC) 100-10 MG/5ML syrup Iophen C-NR 10 mg-100 mg/5 mL oral liquid     lisdexamfetamine (VYVANSE) 40 MG capsule Take 1 capsule (40 mg total) by mouth daily. 30 capsule 0   [START ON 10/29/2020] lisdexamfetamine (VYVANSE) 40 MG capsule Take 1 capsule (40 mg total) by mouth every morning. 30 capsule 0   omeprazole (PRILOSEC) 40 MG capsule Take 40 mg by mouth 2 (two) times daily.     phenylephrine-shark liver oil-mineral oil-petrolatum (PREPARATION H) 0.25-3-14-71.9 % rectal ointment Place 1 application rectally 2 (two) times daily as needed for hemorrhoids.     Prenatal Vit-Fe Fumarate-FA (PRENATAL MULTIVITAMIN) TABS tablet Take 1 tablet by mouth daily at 12 noon.     ranitidine (ZANTAC) 150 MG tablet Take 150 mg by mouth 2 (two) times daily.     No current facility-administered medications for this visit.    Medication Side Effects: None  Allergies: No Known Allergies  Past Medical History:  Diagnosis Date   Endometriosis    GERD (gastroesophageal reflux disease)    tums prn   Gestational diabetes mellitus, antepartum    Seasonal allergies  SVD (spontaneous vaginal delivery)    x 2    Family History  Problem Relation Age of Onset   Hypertension Father    Heart disease Father    Cancer Maternal Grandmother    Breast cancer Maternal Grandmother    Breast cancer Paternal Grandmother     Social History   Socioeconomic History   Marital status: Married    Spouse name: Not on file   Number of children: Not on file   Years of education: Not on file   Highest education level: Not on file  Occupational History   Not on  file  Tobacco Use   Smoking status: Never Smoker   Smokeless tobacco: Never Used  Substance and Sexual Activity   Alcohol use: No   Drug use: No   Sexual activity: Yes    Birth control/protection: None    Comment: approx 11 wlks gestation as of 01/09/15  Other Topics Concern   Not on file  Social History Narrative   Not on file   Social Determinants of Health   Financial Resource Strain:    Difficulty of Paying Living Expenses: Not on file  Food Insecurity:    Worried About Programme researcher, broadcasting/film/video in the Last Year: Not on file   [employer] of Food in the Last Year: Not on file  Transportation Needs:    Lack of Transportation (Medical): Not on file   Lack of Transportation (Non-Medical): Not on file  Physical Activity:    Days of Exercise per Week: Not on file   Minutes of Exercise per Session: Not on file  Stress:    Feeling of Stress : Not on file  Social Connections:    Frequency of Communication with Friends and Family: Not on file   Frequency of Social Gatherings with Friends and Family: Not on file   Attends Religious Services: Not on file   Active Member of Clubs or Organizations: Not on file   Attends Banker Meetings: Not on file   Marital Status: Not on file  Intimate Partner Violence:    Fear of Current or Ex-Partner: Not on file   Emotionally Abused: Not on file   Physically Abused: Not on file   Sexually Abused: Not on file    Past Medical History, Surgical history, Social history, and Family history were reviewed and updated as appropriate.   Please see review of systems for further details on the patient's review from today.   Objective:   Physical Exam:  There were no vitals taken for this visit.  Physical Exam Constitutional:      General: She is not in acute distress. Musculoskeletal:        General: No deformity.  Neurological:     Mental Status: She is alert and oriented to person, place, and time.      Coordination: Coordination normal.  Psychiatric:        Attention and Perception: Attention and perception normal. She does not perceive auditory or visual hallucinations.        Mood and Affect: Mood normal. Mood is not anxious or depressed. Affect is not labile, blunt, angry or inappropriate.        Speech: Speech normal.        Behavior: Behavior normal.        Thought Content: Thought content normal. Thought content is not paranoid or delusional. Thought content does not include homicidal or suicidal ideation. Thought content does not include homicidal or suicidal plan.  Cognition and Memory: Cognition and memory normal.        Judgment: Judgment normal.     Comments: Insight intact     Lab Review:     Component Value Date/Time   NA 135 07/25/2015 0745   K 3.7 07/25/2015 0745   CL 105 07/25/2015 0745   CO2 19 (L) 07/25/2015 0745   GLUCOSE 80 07/25/2015 0745   BUN 8 07/25/2015 0745   CREATININE 0.65 07/25/2015 0745   CREATININE 1.15 (H) 02/15/2013 1100   CALCIUM 8.7 (L) 07/25/2015 0745   PROT 7.6 02/15/2013 1100   ALBUMIN 4.1 02/15/2013 1100   AST 28 02/15/2013 1100   ALT 28 02/15/2013 1100   ALKPHOS 64 02/15/2013 1100   BILITOT 0.4 02/15/2013 1100   GFRNONAA >60 07/25/2015 0745   GFRAA >60 07/25/2015 0745       Component Value Date/Time   WBC 9.8 07/26/2015 0543   RBC 3.72 (L) 07/26/2015 0543   HGB 11.8 (L) 07/26/2015 0543   HCT 35.8 (L) 07/26/2015 0543   PLT 197 07/26/2015 0543   MCV 96.2 07/26/2015 0543   MCV 96.1 02/15/2013 1102   MCH 31.7 07/26/2015 0543   MCHC 33.0 07/26/2015 0543   RDW 14.3 07/26/2015 0543    No results found for: POCLITH, LITHIUM   No results found for: PHENYTOIN, PHENOBARB, VALPROATE, CBMZ   .res Assessment: Plan:    Plan:  1. Continue Prozac 60mg  daily  2. Continue Xanax 0.25mg  daily prn anxiety  3. Increase Vyvanse 30mg  to 40mg  daily for binge eating disorder  Melatonin at hs prn  126/86 - 76  RTC 2  months  Patient advised to contact office with any questions, adverse effects, or acute worsening in signs and symptoms.  Discussed potential benefits, risk, and side effects of benzodiazepines to include potential risk of tolerance and dependence, as well as possible drowsiness.  Advised patient not to drive if experiencing drowsiness and to take lowest possible effective dose to minimize risk of dependence and tolerance.  Discussed potential benefits, risks, and side effects of stimulants with patient to include increased heart rate, palpitations, insomnia, increased anxiety, increased irritability, or decreased appetite.  Instructed patient to contact office if experiencing any significant tolerability issues.     Diagnoses and all orders for this visit:  Insomnia, unspecified type  Major depressive disorder, recurrent episode, moderate (HCC)  Generalized anxiety disorder  Eating disorder, unspecified type -     lisdexamfetamine (VYVANSE) 40 MG capsule; Take 1 capsule (40 mg total) by mouth daily. -     lisdexamfetamine (VYVANSE) 40 MG capsule; Take 1 capsule (40 mg total) by mouth every morning.     Please see After Visit Summary for patient specific instructions.  No future appointments.  No orders of the defined types were placed in this encounter.   -------------------------------

## 2020-11-26 ENCOUNTER — Ambulatory Visit: Payer: Self-pay | Admitting: Adult Health

## 2020-11-26 ENCOUNTER — Telehealth: Payer: Self-pay | Admitting: Adult Health

## 2020-11-26 ENCOUNTER — Other Ambulatory Visit: Payer: Self-pay | Admitting: Adult Health

## 2020-11-26 DIAGNOSIS — F509 Eating disorder, unspecified: Secondary | ICD-10-CM

## 2020-11-26 MED ORDER — LISDEXAMFETAMINE DIMESYLATE 40 MG PO CAPS: 40.0000 mg | ORAL_CAPSULE | Freq: Every day | ORAL | 0 refills | Status: DC

## 2020-11-26 NOTE — Telephone Encounter (Signed)
RS today to 2/11 due to weather. Please refill Vyvanse one more time to [facility].

## 2020-11-26 NOTE — Telephone Encounter (Signed)
Script sent  

## 2020-12-03 ENCOUNTER — Telehealth: Payer: Self-pay | Admitting: Adult Health

## 2020-12-03 ENCOUNTER — Other Ambulatory Visit: Payer: Self-pay | Admitting: Adult Health

## 2020-12-03 MED ORDER — AMPHETAMINE-DEXTROAMPHET ER 30 MG PO CP24: 30.0000 mg | ORAL_CAPSULE | Freq: Every day | ORAL | 0 refills | Status: DC

## 2020-12-03 NOTE — Telephone Encounter (Signed)
Pt lm stating due to a change in insurance the medication Vyvanse prescribed is too expensive even with the coupon. Please look into other medication comparable to Vyvanse. Pt stated she inquired about a few on line. [facility].

## 2020-12-03 NOTE — Telephone Encounter (Signed)
Pls advise patient - Adderall XR 30mg  every morning sent in to replace Vyvanse 40mg  daily. Have her call with an update on how working.

## 2020-12-21 ENCOUNTER — Other Ambulatory Visit: Payer: Self-pay

## 2020-12-21 ENCOUNTER — Encounter: Payer: Self-pay | Admitting: Adult Health

## 2020-12-21 ENCOUNTER — Ambulatory Visit (INDEPENDENT_AMBULATORY_CARE_PROVIDER_SITE_OTHER): Payer: BC Managed Care – PPO | Admitting: Adult Health

## 2020-12-21 DIAGNOSIS — G4700 Insomnia, unspecified: Secondary | ICD-10-CM | POA: Diagnosis not present

## 2020-12-21 DIAGNOSIS — F411 Generalized anxiety disorder: Secondary | ICD-10-CM

## 2020-12-21 DIAGNOSIS — F331 Major depressive disorder, recurrent, moderate: Secondary | ICD-10-CM | POA: Diagnosis not present

## 2020-12-21 DIAGNOSIS — F909 Attention-deficit hyperactivity disorder, unspecified type: Secondary | ICD-10-CM

## 2020-12-21 MED ORDER — AMPHETAMINE-DEXTROAMPHET ER 30 MG PO CP24: 30.0000 mg | ORAL_CAPSULE | Freq: Every day | ORAL | 0 refills | Status: DC

## 2020-12-21 MED ORDER — ALPRAZOLAM 0.25 MG PO TABS: 0.2500 mg | ORAL_TABLET | Freq: Every day | ORAL | 2 refills | Status: DC

## 2020-12-21 NOTE — Progress Notes (Signed)
Karen Norman [id number] Dec 01, 1980 40 y.o.  Subjective:   Patient ID:  Karen Norman is a 40 y.o. (DOB December 11, 1980) female.  Chief Complaint: No chief complaint on file.   HPI Karen Norman presents to the office today for follow-up of MDD, GAD, insomnia, and eating disorder.  Describes mood today as "ok". Pleasant. Mood symptoms - reports some depression, anxiety, and irritability. Has gotten "irritated" a few times - usually in the evenings. Not getting as overwhelmed. Is able to organize things in her brain better. Reports decreased binge eating with addition of Vyvanse. Improved interest and motivation. Taking medications as prescribed.  Energy levels "a lot better". Active, does not have a regular exercise routine - "but I feel like I could".  Enjoys some usual interests and activities. Married. Lives with husband of 14 years and 3 children - 2 boys and one girl - 10, 8, and 4. Spending time with family.  Appetite adequate. Weight loss - clothes fitting differently. Sleeps well most nights. Averages 6 to 8 hours.  Focus and concentration stable. Completing tasks. Managing aspects of household. Work going well. Works part-time as a Human resources officer. Denies SI or HI. Denies AH or VH.     PHQ2-9   Flowsheet Row Nutrition from 05/23/2015 in [facility]  PHQ-2 Total Score 0       Review of Systems:  Review of Systems  Musculoskeletal: Negative for gait problem.  Neurological: Negative for tremors.  Psychiatric/Behavioral:       Please refer to HPI    Medications: I have reviewed the patient's current medications.  Current Outpatient Medications  Medication Sig Dispense Refill  . acetaminophen (TYLENOL) 500 MG tablet Take 1,000 mg by mouth every 6 (six) hours as needed for moderate pain.    Marland Kitchen ALPRAZolam (XANAX) 0.25 MG tablet Take 1 tablet (0.25 mg total) by mouth daily. 30 tablet 2  . amphetamine-dextroamphetamine (ADDERALL XR) 30 MG  24 hr capsule Take 1 capsule (30 mg total) by mouth daily. 30 capsule 0  . calcium carbonate (TUMS - DOSED IN MG ELEMENTAL CALCIUM) 500 MG chewable tablet Chew 2 tablets by mouth daily as needed for indigestion or heartburn.    . cetirizine (ZYRTEC) 10 MG tablet Take 10 mg by mouth daily.    Marland Kitchen ESTARYLLA 0.25-35 MG-MCG tablet Take 1 tablet by mouth daily.    Marland Kitchen FLUoxetine (PROZAC) 20 MG capsule TAKE 3 CAPSULES BY MOUTH EVERY DAY 270 capsule 1  . guaiFENesin-codeine (ROBITUSSIN AC) 100-10 MG/5ML syrup Iophen C-NR 10 mg-100 mg/5 mL oral liquid    . omeprazole (PRILOSEC) 40 MG capsule Take 40 mg by mouth 2 (two) times daily.    . phenylephrine-shark liver oil-mineral oil-petrolatum (PREPARATION H) 0.25-3-14-71.9 % rectal ointment Place 1 application rectally 2 (two) times daily as needed for hemorrhoids.    . Prenatal Vit-Fe Fumarate-FA (PRENATAL MULTIVITAMIN) TABS tablet Take 1 tablet by mouth daily at 12 noon.    . ranitidine (ZANTAC) 150 MG tablet Take 150 mg by mouth 2 (two) times daily.     No current facility-administered medications for this visit.    Medication Side Effects: None  Allergies: No Known Allergies  Past Medical History:  Diagnosis Date  . Endometriosis   . GERD (gastroesophageal reflux disease)    tums prn  . Gestational diabetes mellitus, antepartum   . Seasonal allergies   . SVD (spontaneous vaginal delivery)    x 2    Family History  Problem Relation  Age of Onset  . Hypertension Father   . Heart disease Father   . Cancer Maternal Grandmother   . Breast cancer Maternal Grandmother   . Breast cancer Paternal Grandmother     Social History   Socioeconomic History  . Marital status: Married    Spouse name: Not on file  . Number of children: Not on file  . Years of education: Not on file  . Highest education level: Not on file  Occupational History  . Not on file  Tobacco Use  . Smoking status: Never Smoker  . Smokeless tobacco: Never Used  Substance  and Sexual Activity  . Alcohol use: No  . Drug use: No  . Sexual activity: Yes    Birth control/protection: None    Comment: approx 11 wlks gestation as of 01/09/15  Other Topics Concern  . Not on file  Social History Narrative  . Not on file   Social Determinants of Health   Financial Resource Strain: Not on file  Food Insecurity: Not on file  Transportation Needs: Not on file  Physical Activity: Not on file  Stress: Not on file  Social Connections: Not on file  Intimate Partner Violence: Not on file    Past Medical History, Surgical history, Social history, and Family history were reviewed and updated as appropriate.   Please see review of systems for further details on the patient's review from today.   Objective:   Physical Exam:  There were no vitals taken for this visit.  Physical Exam Constitutional:      General: She is not in acute distress. Musculoskeletal:        General: No deformity.  Neurological:     Mental Status: She is alert and oriented to person, place, and time.     Coordination: Coordination normal.  Psychiatric:        Attention and Perception: Attention and perception normal. She does not perceive auditory or visual hallucinations.        Mood and Affect: Mood normal. Mood is not anxious or depressed. Affect is not labile, blunt, angry or inappropriate.        Speech: Speech normal.        Behavior: Behavior normal.        Thought Content: Thought content normal. Thought content is not paranoid or delusional. Thought content does not include homicidal or suicidal ideation. Thought content does not include homicidal or suicidal plan.        Cognition and Memory: Cognition and memory normal.        Judgment: Judgment normal.     Comments: Insight intact     Lab Review:     Component Value Date/Time   NA 135 07/25/2015 0745   K 3.7 07/25/2015 0745   CL 105 07/25/2015 0745   CO2 19 (L) 07/25/2015 0745   GLUCOSE 80 07/25/2015 0745   BUN 8  07/25/2015 0745   CREATININE 0.65 07/25/2015 0745   CREATININE 1.15 (H) 02/15/2013 1100   CALCIUM 8.7 (L) 07/25/2015 0745   PROT 7.6 02/15/2013 1100   ALBUMIN 4.1 02/15/2013 1100   AST 28 02/15/2013 1100   ALT 28 02/15/2013 1100   ALKPHOS 64 02/15/2013 1100   BILITOT 0.4 02/15/2013 1100   GFRNONAA >60 07/25/2015 0745   GFRAA >60 07/25/2015 0745       Component Value Date/Time   WBC 9.8 07/26/2015 0543   RBC 3.72 (L) 07/26/2015 0543   HGB 11.8 (L) 07/26/2015 0543  HCT 35.8 (L) 07/26/2015 0543   PLT 197 07/26/2015 0543   MCV 96.2 07/26/2015 0543   MCV 96.1 02/15/2013 1102   MCH 31.7 07/26/2015 0543   MCHC 33.0 07/26/2015 0543   RDW 14.3 07/26/2015 0543    No results found for: POCLITH, LITHIUM   No results found for: PHENYTOIN, PHENOBARB, VALPROATE, CBMZ   .res Assessment: Plan:     Plan:  1. Continue Prozac 60mg  daily  2. Continue Xanax 0.25mg  daily prn anxiety  3. Continue Adderall XR 30mg  daily for binge eating disorder  Melatonin at hs prn  119/81 - 90  RTC 3 months  Patient advised to contact office with any questions, adverse effects, or acute worsening in signs and symptoms.  Discussed potential benefits, risk, and side effects of benzodiazepines to include potential risk of tolerance and dependence, as well as possible drowsiness.  Advised patient not to drive if experiencing drowsiness and to take lowest possible effective dose to minimize risk of dependence and tolerance.  Discussed potential benefits, risks, and side effects of stimulants with patient to include increased heart rate, palpitations, insomnia, increased anxiety, increased irritability, or decreased appetite.  Instructed patient to contact office if experiencing any significant tolerability issues.      Diagnoses and all orders for this visit:  Attention deficit hyperactivity disorder (ADHD), unspecified ADHD type -     amphetamine-dextroamphetamine (ADDERALL XR) 30 MG 24 hr capsule;  Take 1 capsule (30 mg total) by mouth daily.  Generalized anxiety disorder -     ALPRAZolam (XANAX) 0.25 MG tablet; Take 1 tablet (0.25 mg total) by mouth daily.  Major depressive disorder, recurrent episode, moderate (HCC)  Insomnia, unspecified type     Please see After Visit Summary for patient specific instructions.  No future appointments.  No orders of the defined types were placed in this encounter.   -------------------------------

## 2021-01-01 ENCOUNTER — Other Ambulatory Visit: Payer: Self-pay | Admitting: Adult Health

## 2021-01-01 ENCOUNTER — Telehealth: Payer: Self-pay | Admitting: Adult Health

## 2021-01-01 DIAGNOSIS — F909 Attention-deficit hyperactivity disorder, unspecified type: Secondary | ICD-10-CM

## 2021-01-01 MED ORDER — AMPHETAMINE-DEXTROAMPHET ER 30 MG PO CP24: 30.0000 mg | ORAL_CAPSULE | Freq: Every day | ORAL | 0 refills | Status: DC

## 2021-01-01 NOTE — Telephone Encounter (Signed)
Script sent  

## 2021-01-01 NOTE — Telephone Encounter (Signed)
Pt called and left a message stating that the adderall xr 30 mg that was sent [facility] needs to be cancelled. They don't have enough of the medicine and so a new script needs [facility] [place] on [address].

## 2021-01-21 ENCOUNTER — Telehealth: Payer: Self-pay | Admitting: Adult Health

## 2021-01-21 ENCOUNTER — Other Ambulatory Visit: Payer: Self-pay | Admitting: Adult Health

## 2021-01-21 DIAGNOSIS — F909 Attention-deficit hyperactivity disorder, unspecified type: Secondary | ICD-10-CM

## 2021-01-21 MED ORDER — AMPHETAMINE-DEXTROAMPHET ER 30 MG PO CP24: 30.0000 mg | ORAL_CAPSULE | Freq: Every day | ORAL | 0 refills | Status: DC

## 2021-01-21 NOTE — Telephone Encounter (Signed)
Script sent  

## 2021-01-21 NOTE — Telephone Encounter (Signed)
Pt called and said that she is doing fine on the adderall xr 30 mg .She needs a [facility] [place] on [address]

## 2021-03-01 ENCOUNTER — Telehealth: Payer: Self-pay | Admitting: Adult Health

## 2021-03-01 ENCOUNTER — Other Ambulatory Visit: Payer: Self-pay

## 2021-03-01 DIAGNOSIS — F909 Attention-deficit hyperactivity disorder, unspecified type: Secondary | ICD-10-CM

## 2021-03-01 MED ORDER — AMPHETAMINE-DEXTROAMPHET ER 30 MG PO CP24: 30.0000 mg | ORAL_CAPSULE | Freq: Every day | ORAL | 0 refills | Status: DC

## 2021-03-01 NOTE — Telephone Encounter (Signed)
Last filled 01/29/21 no refills.pended for review

## 2021-03-01 NOTE — Telephone Encounter (Signed)
Script sent  

## 2021-03-01 NOTE — Telephone Encounter (Signed)
Pt called and left a message about a refill on her adderall xr 30 mg to be [facility] [place]  On [address]

## 2021-03-11 ENCOUNTER — Ambulatory Visit: Payer: BC Managed Care – PPO | Admitting: Adult Health

## 2021-03-27 ENCOUNTER — Ambulatory Visit: Payer: BC Managed Care – PPO | Admitting: Adult Health

## 2021-03-27 ENCOUNTER — Other Ambulatory Visit: Payer: Self-pay

## 2021-03-27 ENCOUNTER — Ambulatory Visit (INDEPENDENT_AMBULATORY_CARE_PROVIDER_SITE_OTHER): Payer: BC Managed Care – PPO | Admitting: Adult Health

## 2021-03-27 ENCOUNTER — Encounter: Payer: Self-pay | Admitting: Adult Health

## 2021-03-27 DIAGNOSIS — F909 Attention-deficit hyperactivity disorder, unspecified type: Secondary | ICD-10-CM

## 2021-03-27 DIAGNOSIS — F331 Major depressive disorder, recurrent, moderate: Secondary | ICD-10-CM | POA: Diagnosis not present

## 2021-03-27 DIAGNOSIS — F509 Eating disorder, unspecified: Secondary | ICD-10-CM | POA: Diagnosis not present

## 2021-03-27 DIAGNOSIS — F411 Generalized anxiety disorder: Secondary | ICD-10-CM

## 2021-03-27 NOTE — Progress Notes (Signed)
Karen Norman [id number] [birth year] 40 y.o.  Subjective:   Patient ID:  Karen Norman is a 40 y.o. (DOB 1981/02/12) female.  Chief Complaint: No chief complaint on file.   HPI Karen Norman presents to the office today for follow-up of MDD, GAD, insomnia, and eating disorder.  Describes mood today as "ok". Pleasant. Mood symptoms - reports decreased depression, anxiety, and irritability. Working with integrative medicine. Has made some positive changes and is feeling better. Has stopped taking Adderall - "doing fine without it". Stable interest and motivation. Taking medications as prescribed.  Energy levels improved. Active, does not have a regular exercise routine.  Enjoys some usual interests and activities. Married. Lives with husband of 14 years and 3 children - 2 boys and one girl - 9, 10, and 5. Spending time with family.  Appetite adequate. Weight loss - 3 pounds. Sleeps well most nights. Averages 6 to 8 hours.  Focus and concentration stable - has stopped Adderall. Completing tasks. Managing aspects of household. Work going well. 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 Denies SI or HI.  Denies AH or VH.   PHQ2-9   Flowsheet Row Nutrition from 05/23/2015 in [facility]  PHQ-2 Total Score 0       Review of Systems:  Review of Systems  Musculoskeletal: Negative for gait problem.  Neurological: Negative for tremors.  Psychiatric/Behavioral:       Please refer to HPI    Medications: I have reviewed the patient's current medications.  Current Outpatient Medications  Medication Sig Dispense Refill  . acetaminophen (TYLENOL) 500 MG tablet Take 1,000 mg by mouth every 6 (six) hours as needed for moderate pain.    Marland Kitchen ALPRAZolam (XANAX) 0.25 MG tablet Take 1 tablet (0.25 mg total) by mouth daily. 30 tablet 2  . amphetamine-dextroamphetamine (ADDERALL XR) 30 MG 24 hr capsule Take 1 capsule (30  mg total) by mouth daily. 30 capsule 0  . calcium carbonate (TUMS - DOSED IN MG ELEMENTAL CALCIUM) 500 MG chewable tablet Chew 2 tablets by mouth daily as needed for indigestion or heartburn.    . cetirizine (ZYRTEC) 10 MG tablet Take 10 mg by mouth daily.    Marland Kitchen ESTARYLLA 0.25-35 MG-MCG tablet Take 1 tablet by mouth daily.    Marland Kitchen FLUoxetine (PROZAC) 20 MG capsule TAKE 3 CAPSULES BY MOUTH EVERY DAY 270 capsule 1  . guaiFENesin-codeine (ROBITUSSIN AC) 100-10 MG/5ML syrup Iophen C-NR 10 mg-100 mg/5 mL oral liquid    . omeprazole (PRILOSEC) 40 MG capsule Take 40 mg by mouth 2 (two) times daily.    . phenylephrine-shark liver oil-mineral oil-petrolatum (PREPARATION H) 0.25-3-14-71.9 % rectal ointment Place 1 application rectally 2 (two) times daily as needed for hemorrhoids.    . Prenatal Vit-Fe Fumarate-FA (PRENATAL MULTIVITAMIN) TABS tablet Take 1 tablet by mouth daily at 12 noon.    . ranitidine (ZANTAC) 150 MG tablet Take 150 mg by mouth 2 (two) times daily.     No current facility-administered medications for this visit.    Medication Side Effects: None  Allergies: No Known Allergies  Past Medical History:  Diagnosis Date  . Endometriosis   . GERD (gastroesophageal reflux disease)    tums prn  . Gestational diabetes mellitus, antepartum   . Seasonal allergies   . SVD (spontaneous vaginal delivery)    x 2    Past Medical History, Surgical history, Social history, and Family history were reviewed  and updated as appropriate.   Please see review of systems for further details on the patient's review from today.   Objective:   Physical Exam:  There were no vitals taken for this visit.  Physical Exam Constitutional:      General: She is not in acute distress. Musculoskeletal:        General: No deformity.  Neurological:     Mental Status: She is alert and oriented to person, place, and time.     Coordination: Coordination normal.  Psychiatric:        Attention and Perception:  Attention and perception normal. She does not perceive auditory or visual hallucinations.        Mood and Affect: Mood normal. Mood is not anxious or depressed. Affect is not labile, blunt, angry or inappropriate.        Speech: Speech normal.        Behavior: Behavior normal.        Thought Content: Thought content normal. Thought content is not paranoid or delusional. Thought content does not include homicidal or suicidal ideation. Thought content does not include homicidal or suicidal plan.        Cognition and Memory: Cognition and memory normal.        Judgment: Judgment normal.     Comments: Insight intact     Lab Review:     Component Value Date/Time   NA 135 07/25/2015 0745   K 3.7 07/25/2015 0745   CL 105 07/25/2015 0745   CO2 19 (L) 07/25/2015 0745   GLUCOSE 80 07/25/2015 0745   BUN 8 07/25/2015 0745   CREATININE 0.65 07/25/2015 0745   CREATININE 1.15 (H) 02/15/2013 1100   CALCIUM 8.7 (L) 07/25/2015 0745   PROT 7.6 02/15/2013 1100   ALBUMIN 4.1 02/15/2013 1100   AST 28 02/15/2013 1100   ALT 28 02/15/2013 1100   ALKPHOS 64 02/15/2013 1100   BILITOT 0.4 02/15/2013 1100   GFRNONAA >60 07/25/2015 0745   GFRAA >60 07/25/2015 0745       Component Value Date/Time   WBC 9.8 07/26/2015 0543   RBC 3.72 (L) 07/26/2015 0543   HGB 11.8 (L) 07/26/2015 0543   HCT 35.8 (L) 07/26/2015 0543   PLT 197 07/26/2015 0543   MCV 96.2 07/26/2015 0543   MCV 96.1 02/15/2013 1102   MCH 31.7 07/26/2015 0543   MCHC 33.0 07/26/2015 0543   RDW 14.3 07/26/2015 0543    No results found for: POCLITH, LITHIUM   No results found for: PHENYTOIN, PHENOBARB, VALPROATE, CBMZ   .res Assessment: Plan:    Plan:  1. Continue Prozac 60mg  daily  2. Continue Xanax 0.25mg  daily prn anxiety   Melatonin at hs prn  RTC 6 months  Patient advised to contact office with any questions, adverse effects, or acute worsening in signs and symptoms.  Discussed potential benefits, risk, and side effects  of benzodiazepines to include potential risk of tolerance and dependence, as well as possible drowsiness.  Advised patient not to drive if experiencing drowsiness and to take lowest possible effective dose to minimize risk of dependence and tolerance.  Discussed potential benefits, risks, and side effects of stimulants with patient to include increased heart rate, palpitations, insomnia, increased anxiety, increased irritability, or decreased appetite.  Instructed patient to contact office if experiencing any significant tolerability issues.  Diagnoses and all orders for this visit:  Attention deficit hyperactivity disorder (ADHD), unspecified ADHD type  Generalized anxiety disorder  Major depressive disorder, recurrent episode, moderate (  HCC)  Eating disorder, unspecified type     Please see After Visit Summary for patient specific instructions.  No future appointments.  No orders of the defined types were placed in this encounter.   -------------------------------

## 2021-03-30 ENCOUNTER — Other Ambulatory Visit: Payer: Self-pay | Admitting: Adult Health

## 2021-03-30 DIAGNOSIS — F331 Major depressive disorder, recurrent, moderate: Secondary | ICD-10-CM

## 2021-03-30 DIAGNOSIS — F509 Eating disorder, unspecified: Secondary | ICD-10-CM

## 2021-03-30 DIAGNOSIS — F411 Generalized anxiety disorder: Secondary | ICD-10-CM

## 2021-07-22 LAB — COMPREHENSIVE METABOLIC PANEL
Albumin: 4 (ref 3.5–5.0)
Calcium: 9.1 (ref 8.7–10.7)
Globulin: 2.9

## 2021-07-22 LAB — VITAMIN D 25 HYDROXY (VIT D DEFICIENCY, FRACTURES): Vit D, 25-Hydroxy: 36.6

## 2021-07-22 LAB — CBC: RBC: 4.18 (ref 3.87–5.11)

## 2021-07-22 LAB — HEPATIC FUNCTION PANEL
AST: 26 (ref 13–35)
Alkaline Phosphatase: 124 (ref 25–125)
Bilirubin, Direct: 28 — AB (ref 0.01–0.4)
Bilirubin, Total: 0.2

## 2021-07-22 LAB — TSH: TSH: 3.41 (ref 0.41–5.90)

## 2021-07-22 LAB — BASIC METABOLIC PANEL
BUN: 11 (ref 4–21)
CO2: 21 (ref 13–22)
Chloride: 100 (ref 99–108)
Creatinine: 1 (ref 0.5–1.1)
Glucose: 76
Potassium: 4 (ref 3.4–5.3)
Sodium: 137 (ref 137–147)

## 2021-07-22 LAB — CBC AND DIFFERENTIAL
HCT: 38 (ref 36–46)
Hemoglobin: 12.7 (ref 12.0–16.0)
Neutrophils Absolute: 5.3
Platelets: 323 (ref 150–399)
WBC: 9.3

## 2021-07-22 LAB — IRON,TIBC AND FERRITIN PANEL
%SAT: 20
Ferritin: 40
Iron: 7
TIBC: 358
UIBC: 285

## 2021-07-22 LAB — VITAMIN B12: Vitamin B-12: 5.7

## 2021-07-22 LAB — HEMOGLOBIN A1C: Hemoglobin A1C: 5.5

## 2021-09-17 DIAGNOSIS — Z0289 Encounter for other administrative examinations: Secondary | ICD-10-CM

## 2021-09-25 ENCOUNTER — Ambulatory Visit: Payer: BC Managed Care – PPO | Admitting: Adult Health

## 2021-10-09 ENCOUNTER — Encounter: Payer: Self-pay | Admitting: Adult Health

## 2021-10-09 ENCOUNTER — Ambulatory Visit: Payer: BC Managed Care – PPO | Admitting: Adult Health

## 2021-10-09 ENCOUNTER — Other Ambulatory Visit: Payer: Self-pay

## 2021-10-09 DIAGNOSIS — F331 Major depressive disorder, recurrent, moderate: Secondary | ICD-10-CM | POA: Diagnosis not present

## 2021-10-09 DIAGNOSIS — F411 Generalized anxiety disorder: Secondary | ICD-10-CM

## 2021-10-09 MED ORDER — FLUOXETINE HCL 40 MG PO CAPS: 40.0000 mg | ORAL_CAPSULE | Freq: Every day | ORAL | 3 refills | Status: DC

## 2021-10-09 NOTE — Progress Notes (Signed)
Karen Norman [id number] 1981/07/07 40 y.o.  Subjective:   Patient ID:  Karen Norman is a 40 y.o. (DOB [birth year]) female.  Chief Complaint: No chief complaint on file.   HPI MARIANNE GOLIGHTLY presents to the office today for follow-up of MDD and GAD  Describes mood today as "ok". Pleasant. Mood symptoms - reports decreased depression, anxiety, and irritability. Stating "I am doing good". Feels like medication - Prozac continues to work well. Working with integrative medicine - using more supplements. Stable interest and motivation. Taking medications as prescribed.  Energy levels stable. Active, does not have a regular exercise routine.  Enjoys some usual interests and activities. Married. Lives with husband and 3 children - 2 boys and one girl - 53, 10, and 5. Spending time with family.  Appetite adequate. Weight stable. Sleeps well most nights. Averages 6 to 8 hours.  Focus and concentration stable. Completing tasks. Managing aspects of household. Work going well. 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  Denies AH or VH.   PHQ2-9    Flowsheet Row Nutrition from 05/23/2015 in [facility]  PHQ-2 Total Score 0        Review of Systems:  Review of Systems  Musculoskeletal:  Negative for gait problem.  Neurological:  Negative for tremors.  Psychiatric/Behavioral:         Please refer to HPI   Medications: I have reviewed the patient's current medications. Scheduled:  Current Outpatient Medications  Medication Sig Dispense Refill   cetirizine (ZYRTEC) 10 MG tablet Take 10 mg by mouth daily.     FLUoxetine (PROZAC) 40 MG capsule Take 1 capsule (40 mg total) by mouth daily. 90 capsule 3   No current facility-administered medications for this visit.    Medication Side Effects: None  Allergies: No Known Allergies  Past Medical History:  Diagnosis Date   Endometriosis    GERD (gastroesophageal reflux disease)    tums  prn   Gestational diabetes mellitus, antepartum    Seasonal allergies    SVD (spontaneous vaginal delivery)    x 2    Past Medical History, Surgical history, Social history, and Family history were reviewed and updated as appropriate.   Please see review of systems for further details on the patient's review from today.   Objective:   Physical Exam:  There were no vitals taken for this visit.  Physical Exam Constitutional:      General: She is not in acute distress. Musculoskeletal:        General: No deformity.  Neurological:     Mental Status: She is alert and oriented to person, place, and time.     Coordination: Coordination normal.  Psychiatric:        Attention and Perception: Attention and perception normal. She does not perceive auditory or visual hallucinations.        Mood and Affect: Mood normal. Mood is not anxious or depressed. Affect is not labile, blunt, angry or inappropriate.        Speech: Speech normal.        Behavior: Behavior normal.        Thought Content: Thought content normal. Thought content is not paranoid or delusional. Thought content does not include homicidal or suicidal ideation. Thought content does not include homicidal or suicidal plan.        Cognition and Memory: Cognition and memory normal.        Judgment: Judgment normal.  Comments: Insight intact    Lab Review:     Component Value Date/Time   NA 135 07/25/2015 0745   K 3.7 07/25/2015 0745   CL 105 07/25/2015 0745   CO2 19 (L) 07/25/2015 0745   GLUCOSE 80 07/25/2015 0745   BUN 8 07/25/2015 0745   CREATININE 0.65 07/25/2015 0745   CREATININE 1.15 (H) 02/15/2013 1100   CALCIUM 8.7 (L) 07/25/2015 0745   PROT 7.6 02/15/2013 1100   ALBUMIN 4.1 02/15/2013 1100   AST 28 02/15/2013 1100   ALT 28 02/15/2013 1100   ALKPHOS 64 02/15/2013 1100   BILITOT 0.4 02/15/2013 1100   GFRNONAA >60 07/25/2015 0745   GFRAA >60 07/25/2015 0745       Component Value Date/Time   WBC 9.8  07/26/2015 0543   RBC 3.72 (L) 07/26/2015 0543   HGB 11.8 (L) 07/26/2015 0543   HCT 35.8 (L) 07/26/2015 0543   PLT 197 07/26/2015 0543   MCV 96.2 07/26/2015 0543   MCV 96.1 02/15/2013 1102   MCH 31.7 07/26/2015 0543   MCHC 33.0 07/26/2015 0543   RDW 14.3 07/26/2015 0543    No results found for: POCLITH, LITHIUM   No results found for: PHENYTOIN, PHENOBARB, VALPROATE, CBMZ   .res Assessment: Plan:    Plan:  1. Continue Prozac 60mg  to 40mg  daily   RTC 6 months  Patient advised to contact office with any questions, adverse effects, or acute worsening in signs and symptoms.  Discussed potential benefits, risk, and side effects of benzodiazepines to include potential risk of tolerance and dependence, as well as possible drowsiness.  Advised patient not to drive if experiencing drowsiness and to take lowest possible effective dose to minimize risk of dependence and tolerance.  Discussed potential benefits, risks, and side effects of stimulants with patient to include increased heart rate, palpitations, insomnia, increased anxiety, increased irritability, or decreased appetite.  Instructed patient to contact office if experiencing any significant tolerability issues.  Diagnoses and all orders for this visit:  Major depressive disorder, recurrent episode, moderate (HCC) -     FLUoxetine (PROZAC) 40 MG capsule; Take 1 capsule (40 mg total) by mouth daily.  Generalized anxiety disorder -     FLUoxetine (PROZAC) 40 MG capsule; Take 1 capsule (40 mg total) by mouth daily.  Eating disorder, unspecified type -     FLUoxetine (PROZAC) 40 MG capsule; Take 1 capsule (40 mg total) by mouth daily.    Please see After Visit Summary for patient specific instructions.  Future Appointments  Date Time Provider Department Center  11/12/2021  8:00 AM , DO [facility] None  12/03/2021  3:40 PM Helane Rima, DO [facility] None    No orders of the defined types were placed in this  encounter.   -------------------------------

## 2021-10-14 ENCOUNTER — Other Ambulatory Visit: Payer: Self-pay | Admitting: Adult Health

## 2021-10-14 DIAGNOSIS — F509 Eating disorder, unspecified: Secondary | ICD-10-CM

## 2021-10-14 DIAGNOSIS — F331 Major depressive disorder, recurrent, moderate: Secondary | ICD-10-CM

## 2021-10-14 DIAGNOSIS — F411 Generalized anxiety disorder: Secondary | ICD-10-CM

## 2021-11-12 ENCOUNTER — Encounter (INDEPENDENT_AMBULATORY_CARE_PROVIDER_SITE_OTHER): Payer: Self-pay | Admitting: Family Medicine

## 2021-11-12 ENCOUNTER — Other Ambulatory Visit: Payer: Self-pay

## 2021-11-12 ENCOUNTER — Ambulatory Visit (INDEPENDENT_AMBULATORY_CARE_PROVIDER_SITE_OTHER): Payer: BC Managed Care – PPO | Admitting: Family Medicine

## 2021-11-12 VITALS — BP 119/83 | HR 68 | Temp 97.8°F | Ht 64.0 in | Wt 213.0 lb

## 2021-11-12 DIAGNOSIS — R0609 Other forms of dyspnea: Secondary | ICD-10-CM | POA: Diagnosis not present

## 2021-11-12 DIAGNOSIS — Z8632 Personal history of gestational diabetes: Secondary | ICD-10-CM

## 2021-11-12 DIAGNOSIS — F908 Attention-deficit hyperactivity disorder, other type: Secondary | ICD-10-CM

## 2021-11-12 DIAGNOSIS — R0683 Snoring: Secondary | ICD-10-CM

## 2021-11-12 DIAGNOSIS — N809 Endometriosis, unspecified: Secondary | ICD-10-CM

## 2021-11-12 DIAGNOSIS — F419 Anxiety disorder, unspecified: Secondary | ICD-10-CM

## 2021-11-12 DIAGNOSIS — K219 Gastro-esophageal reflux disease without esophagitis: Secondary | ICD-10-CM

## 2021-11-12 DIAGNOSIS — Z1331 Encounter for screening for depression: Secondary | ICD-10-CM | POA: Diagnosis not present

## 2021-11-12 DIAGNOSIS — R5383 Other fatigue: Secondary | ICD-10-CM

## 2021-11-12 DIAGNOSIS — E282 Polycystic ovarian syndrome: Secondary | ICD-10-CM

## 2021-11-12 DIAGNOSIS — R790 Abnormal level of blood mineral: Secondary | ICD-10-CM

## 2021-11-12 DIAGNOSIS — Z6836 Body mass index (BMI) 36.0-36.9, adult: Secondary | ICD-10-CM

## 2021-11-12 DIAGNOSIS — R7301 Impaired fasting glucose: Secondary | ICD-10-CM

## 2021-11-12 DIAGNOSIS — R768 Other specified abnormal immunological findings in serum: Secondary | ICD-10-CM

## 2021-11-12 DIAGNOSIS — E559 Vitamin D deficiency, unspecified: Secondary | ICD-10-CM

## 2021-11-12 DIAGNOSIS — F5081 Binge eating disorder: Secondary | ICD-10-CM

## 2021-11-12 DIAGNOSIS — Z8659 Personal history of other mental and behavioral disorders: Secondary | ICD-10-CM

## 2021-11-12 MED ORDER — WEGOVY 0.25 MG/0.5ML ~~LOC~~ SOAJ: 0.2500 mg | SUBCUTANEOUS | 0 refills | Status: DC

## 2021-11-12 NOTE — Progress Notes (Signed)
Chief Complaint:   OBESITY Karen Norman (MR# [id number]) is a 41 y.o. female who presents for evaluation and treatment of obesity and related comorbidities. Current BMI is Body mass index is 36.56 kg/m. Karen Norman has been struggling with her weight for many years and has been unsuccessful in either losing weight, maintaining weight loss, or reaching her healthy weight goal.  Karen Norman is currently in the action stage of change and ready to dedicate time achieving and maintaining a healthier weight. Karen Norman is interested in becoming our patient and working on intensive lifestyle modifications including (but not limited to) diet and exercise for weight loss.  Karen Norman goes to [employer].  She is a Radiation protection practitioner, working 40 hours per week.  She lives with her husband and 3 children (12, 24 (special needs), and 6).  She gets in 6000 steps per day on average.  She endorses binge eating behaviors.  She had labs drawn on 07/22/2021.  Her B12 was low, and she says monthly injections have been very helpful.    Karen Norman's habits were reviewed today and are as follows: Her family eats meals together, she thinks her family will eat healthier with her, her desired weight loss is 60+ pounds, she started gaining weight after giving birth to her son, Karen Norman, her heaviest weight ever was her current weight, she craves sweets, she frequently makes poor food choices, she frequently eats larger portions than normal, she has binge eating behaviors, and she struggles with emotional eating.  Depression Screen Karen Norman Food and Mood (modified PHQ-9) score was 22.  Depression screen PHQ 2/9 11/12/2021  Decreased Interest 3  Down, Depressed, Hopeless 3  PHQ - 2 Score 6  Altered sleeping 3  Tired, decreased energy 3  Change in appetite 3  Feeling bad or failure about yourself  3  Trouble concentrating 1  Moving slowly or fidgety/restless 3  Suicidal thoughts 0  PHQ-9 Score 22   Difficult doing work/chores Very difficult   Assessment/Plan:   1. Other fatigue Karen Norman admits to daytime somnolence and reports waking up still tired. Patent has a history of symptoms of daytime fatigue, morning fatigue, and snoring. Karen Norman generally gets 6 hours of sleep per night, and states that she has generally restful sleep. Snoring is present. Apneic episodes are not present. Epworth Sleepiness Score is 14.  Karen Norman does feel that her weight is causing her energy to be lower than it should be. Fatigue may be related to obesity, depression or many other causes. Labs will be ordered, and in the meanwhile, Karen Norman will focus on self care including making healthy food choices, increasing physical activity and focusing on stress reduction.  - EKG 12-Lead  2. Dyspnea on exertion Karen Norman notes increasing shortness of breath with exercising and seems to be worsening over time with weight gain. She notes getting out of breath sooner with activity than she used to. This has gotten worse recently. Karen Norman denies shortness of breath at rest or orthopnea.  Karen Norman does feel that she gets out of breath more easily that she used to when she exercises. Karen Norman's shortness of breath appears to be obesity related and exercise induced. She has agreed to work on weight loss and gradually increase exercise to treat her exercise induced shortness of breath. Will continue to monitor closely.  - Lipid panel  3. Gastroesophageal reflux disease, unspecified whether esophagitis present Karen Norman is taking Protonix 40 mg daily for GERD.  4. Endometriosis She is current off OCPs.  Per  Karen Norman. She endorses increased pain.  5. PCOS (polycystic ovarian syndrome) She will continue to focus on protein-rich, low simple carbohydrate foods.   Counseling PCOS is a leading cause of menstrual irregularities and infertility. It is also associated with obesity, hirsutism (excessive hair growth on the face, chest, or back),  and cardiovascular risk factors such as high cholesterol and insulin resistance. Insulin resistance appears to play a central role.  Women with PCOS have been shown to have impaired appetite-regulating hormones. Women with polycystic ovary syndrome (PCOS) have an increased risk for cardiovascular disease (CVD) - European Journal of Preventive Cardiology.  6. Snores Karen Norman endorses snoring.  Epworth Sleepiness Score is 14.  7. ANA positive Karen Norman, 1:320 - discovered by allergist.  [facility] this is inconclusive and she needs to lose weight, per patient.  8. Vitamin D deficiency Karen Norman is taking OTC vitamin D.  Plan: Continue current OTC vitamin D supplementation.  Follow-up for routine testing of Vitamin D, at least 2-3 times per year to avoid over-replacement.  9. Fasting hyperglycemia Will start Wegovy 0.25 mg subcutaneously weekly and check her insulin level today.  - Insulin, random  10. Low ferritin Will check ferritin level today.    Nutrition: Iron-rich foods include dark leafy greens, red and white meats, eggs, seafood, and beans.  Certain foods and drinks prevent your body from absorbing iron properly. Avoid eating these foods in the same meal as iron-rich foods or with iron supplements. These foods include: coffee, black tea, and red wine; milk, dairy products, and foods that are high in calcium; beans and soybeans; whole grains. Constipation can be a side effect of iron supplementation. Increased water and fiber intake are helpful. Water goal: > 2 liters/day. Fiber goal: > 25 grams/day.  - Ferritin  11. History of gestational diabetes Diet controlled.  Will check insulin level today.  - Insulin, random  12. History of anorexia nervosa High stress in college.  13. Binge eating disorder Diagnosed by Karen Rackegina Mozingo, NP.    Karen Norman meets binge eating disorder (BED) requirements, including: binging 4-13 times a week , eating until feeling uncomfortably  full, eating alone because of feeling embarrassed by how much you are eating, feeling disgusted, and depressed or guilty about binge eating.   The goals for treatment of BED are to reduce eating binges and to achieve healthy eating habits. Because binge eating can correlate with negative emotions, treatment may also address any other mental-health issues, such as depression. People who binge eat feel as if they don't have control over how much they eat and have feelings of guilt or self-loathing after a binge eating episode.  The FDA has approved Vyvanse as a treatment option for binge eating disorder. Vyvanse targets the brain's reward center by increasing the levels of dopamine and norepinephrine, the chemicals of the brain responsible for feelings of pleasure.  Mindful eating is the recommended nutritional approach to treating BED.   14. Attention deficit hyperactivity disorder (ADHD), other type Diagnosed by Karen Rackegina Mozingo, NP.  She says she tried Vyvanse in the past, but it felt like taking speed.    15. Anxiety Karen Norman is taking Prozac 40 mg daily for anxiety.  PHQ-9 is 22.  Plan:  Continue Prozac.  Continue to follow with Karen Rackegina Mozingo, NP.  16. Depression screening Karen Norman was screened for depression as part of her new patient workup today.  PHQ-9 is 22.  17. Class 2 severe obesity with serious comorbidity and body mass index (BMI) of 36.0 to  36.9 in adult, unspecified obesity type (HCC)  - Start Semaglutide-Weight Management (WEGOVY) 0.25 MG/0.5ML SOAJ; Inject 0.25 mg into the skin once a week.  Dispense: 2 mL; Refill: 0  Karen Norman is currently in the action stage of change and her goal is to continue with weight loss efforts. I recommend Karen Norman begin the structured treatment plan as follows:  She has agreed to the Category 2 Plan.  Exercise goals: No exercise has been prescribed at this time.   Behavioral modification strategies: increasing lean protein intake, decreasing simple  carbohydrates, increasing vegetables, increasing water intake, and decreasing liquid calories.  She was informed of the importance of frequent follow-up visits to maximize her success with intensive lifestyle modifications for her multiple health conditions. She was informed we would discuss her lab results at her next visit unless there is a critical issue that needs to be addressed sooner. Karen Norman agreed to keep her next visit at the agreed upon time to discuss these results.  Objective:   Blood pressure 119/83, pulse 68, temperature 97.8 F (36.6 C), temperature source Oral, height 5\' 4"  (1.626 m), weight 213 lb (96.6 kg), last menstrual period 10/29/2021, SpO2 100 %, unknown if currently breastfeeding. Body mass index is 36.56 kg/m.  EKG: Normal sinus rhythm, rate 77 bpm.  Indirect Calorimeter completed today shows a VO2 of 261 and a REE of 1800.  Her calculated basal metabolic rate is 10/31/2021 thus her basal metabolic rate is better than expected.  General: Cooperative, alert, well developed, in no acute distress. HEENT: Conjunctivae and lids unremarkable. Cardiovascular: Regular rhythm.  Lungs: Normal work of breathing. Neurologic: No focal deficits.   Lab Results  Component Value Date   CREATININE 0.65 07/25/2015   BUN 8 07/25/2015   NA 135 07/25/2015   K 3.7 07/25/2015   CL 105 07/25/2015   CO2 19 (L) 07/25/2015   Lab Results  Component Value Date   ALT 28 02/15/2013   AST 28 02/15/2013   ALKPHOS 64 02/15/2013   BILITOT 0.4 02/15/2013   Lab Results  Component Value Date   WBC 9.8 07/26/2015   HGB 11.8 (L) 07/26/2015   HCT 35.8 (L) 07/26/2015   MCV 96.2 07/26/2015   PLT 197 07/26/2015   Attestation Statements:   This is the patient's first visit at [facility]. The patient's NEW PATIENT PACKET was reviewed at length. Included in the packet: current and past health history, medications, allergies, ROS, gynecologic history (women only), surgical history,  family history, social history, weight history, weight loss surgery history (for those that have had weight loss surgery), nutritional evaluation, mood and food questionnaire, PHQ9, Epworth questionnaire, sleep habits questionnaire, patient life and health improvement goals questionnaire. These will all be scanned into the patient's chart under media.   During the visit, I independently reviewed the patient's EKG, bioimpedance scale results, and indirect calorimeter results. I used this information to tailor a meal plan for the patient that will help her to lose weight and will improve her obesity-related conditions going forward. I performed a medically necessary appropriate examination and/or evaluation. I discussed the assessment and treatment plan with the patient. The patient was provided an opportunity to ask questions and all were answered. The patient agreed with the plan and demonstrated an understanding of the instructions. Labs were ordered at this visit and will be reviewed at the next visit unless more critical results need to be addressed immediately. Clinical information was updated and documented in the EMR.   I,  Insurance claims handlerAmber Norman, CMA, am acting as transcriptionist for Helane RimaErica Natale Barba, DO  I have reviewed the above documentation for accuracy and completeness, and I agree with the above. - Helane RimaErica Sheana Bir, DO, MS, FAAFP, DABOM - Family and Bariatric Medicine.

## 2021-11-13 LAB — LIPID PANEL
Chol/HDL Ratio: 4.7 ratio — ABNORMAL HIGH (ref 0.0–4.4)
Cholesterol, Total: 288 mg/dL — ABNORMAL HIGH (ref 100–199)
HDL: 61 mg/dL (ref >39)
LDL Chol Calc (NIH): 181 mg/dL — ABNORMAL HIGH (ref 0–99)
Triglycerides: 242 mg/dL — ABNORMAL HIGH (ref 0–149)
VLDL Cholesterol Cal: 46 mg/dL — ABNORMAL HIGH (ref 5–40)

## 2021-11-13 LAB — FERRITIN: Ferritin: 34 ng/mL (ref 15–150)

## 2021-11-13 LAB — INSULIN, RANDOM: INSULIN: 9.5 u[IU]/mL (ref 2.6–24.9)

## 2021-11-15 ENCOUNTER — Encounter (INDEPENDENT_AMBULATORY_CARE_PROVIDER_SITE_OTHER): Payer: Self-pay | Admitting: Family Medicine

## 2021-11-18 NOTE — Telephone Encounter (Signed)
Pt last seen by Dr. Wallace.  

## 2021-11-20 ENCOUNTER — Encounter (INDEPENDENT_AMBULATORY_CARE_PROVIDER_SITE_OTHER): Payer: Self-pay

## 2021-11-26 ENCOUNTER — Ambulatory Visit (INDEPENDENT_AMBULATORY_CARE_PROVIDER_SITE_OTHER): Payer: Self-pay | Admitting: Family Medicine

## 2021-12-02 ENCOUNTER — Ambulatory Visit: Payer: BC Managed Care – PPO | Admitting: Podiatry

## 2021-12-03 ENCOUNTER — Other Ambulatory Visit: Payer: Self-pay

## 2021-12-03 ENCOUNTER — Ambulatory Visit (INDEPENDENT_AMBULATORY_CARE_PROVIDER_SITE_OTHER): Payer: BC Managed Care – PPO | Admitting: Family Medicine

## 2021-12-03 ENCOUNTER — Encounter (INDEPENDENT_AMBULATORY_CARE_PROVIDER_SITE_OTHER): Payer: Self-pay | Admitting: Family Medicine

## 2021-12-03 VITALS — BP 119/78 | HR 78 | Temp 98.0°F | Ht 64.0 in | Wt 208.0 lb

## 2021-12-03 DIAGNOSIS — Z6836 Body mass index (BMI) 36.0-36.9, adult: Secondary | ICD-10-CM

## 2021-12-03 DIAGNOSIS — E8881 Metabolic syndrome: Secondary | ICD-10-CM

## 2021-12-03 DIAGNOSIS — E88819 Insulin resistance, unspecified: Secondary | ICD-10-CM

## 2021-12-03 DIAGNOSIS — R632 Polyphagia: Secondary | ICD-10-CM | POA: Diagnosis not present

## 2021-12-03 DIAGNOSIS — Z6835 Body mass index (BMI) 35.0-35.9, adult: Secondary | ICD-10-CM

## 2021-12-03 DIAGNOSIS — R790 Abnormal level of blood mineral: Secondary | ICD-10-CM

## 2021-12-03 DIAGNOSIS — E782 Mixed hyperlipidemia: Secondary | ICD-10-CM

## 2021-12-03 DIAGNOSIS — E669 Obesity, unspecified: Secondary | ICD-10-CM

## 2021-12-04 ENCOUNTER — Ambulatory Visit (INDEPENDENT_AMBULATORY_CARE_PROVIDER_SITE_OTHER): Payer: BC Managed Care – PPO

## 2021-12-04 ENCOUNTER — Ambulatory Visit (INDEPENDENT_AMBULATORY_CARE_PROVIDER_SITE_OTHER): Payer: BC Managed Care – PPO | Admitting: Podiatry

## 2021-12-04 DIAGNOSIS — M7661 Achilles tendinitis, right leg: Secondary | ICD-10-CM | POA: Diagnosis not present

## 2021-12-04 MED ORDER — WEGOVY 0.5 MG/0.5ML ~~LOC~~ SOAJ: 0.5000 mg | SUBCUTANEOUS | 0 refills | Status: DC

## 2021-12-04 MED ORDER — WEGOVY 0.25 MG/0.5ML ~~LOC~~ SOAJ: 0.2500 mg | SUBCUTANEOUS | 0 refills | Status: DC

## 2021-12-04 MED ORDER — DICLOFENAC SODIUM 75 MG PO TBEC: 75.0000 mg | DELAYED_RELEASE_TABLET | Freq: Two times a day (BID) | ORAL | 1 refills | Status: DC

## 2021-12-04 MED ORDER — METHYLPREDNISOLONE 4 MG PO TBPK: ORAL_TABLET | ORAL | 0 refills | Status: DC

## 2021-12-06 ENCOUNTER — Encounter: Payer: Self-pay | Admitting: Podiatry

## 2021-12-09 NOTE — Telephone Encounter (Signed)
Please advise 

## 2021-12-09 NOTE — Progress Notes (Signed)
Chief Complaint:   OBESITY Karen Norman is here to discuss her progress with her obesity treatment plan along with follow-up of her obesity related diagnoses. See Medical Weight Management Flowsheet for complete bioelectrical impedance results.  Today's visit was #: 2 Starting weight: 213 lbs Starting date: 11/12/2021 Weight change since last visit: 5 lbs Total lbs lost to date: 5 lbs Total weight loss percentage to date: -2.35%  Nutrition Plan: Category 2 Plan for 70% of the time.  Activity: Increased activity. Anti-obesity medications: Wegovy 0.25 mg subcutaneously weekly. Reported side effects: None.  Interim History: Karen Norman has been on [facility] for 2 weeks.  She is tolerating well.  She says she has no appetite.  She says she is working on maintaining high protein intake and hydration.  Assessment/Plan:   1. Polyphagia Controlled. Current treatment: Wegovy 0.25 mg subcutaneously weekly.    Plan: Continue Wegovy 0.25 mg subcutaneously weekly.  She will continue to focus on protein-rich, low simple carbohydrate foods. We reviewed the importance of hydration, regular exercise for stress reduction, and restorative sleep.  2. Low ferritin Karen Norman will consider an iron supplement.  Nutrition: Iron-rich foods include dark leafy greens, red and white meats, eggs, seafood, and beans.  Certain foods and drinks prevent your body from absorbing iron properly. Avoid eating these foods in the same meal as iron-rich foods or with iron supplements. These foods include: coffee, black tea, and red wine; milk, dairy products, and foods that are high in calcium; beans and soybeans; whole grains. Constipation can be a side effect of iron supplementation. Increased water and fiber intake are helpful. Water goal: > 2 liters/day. Fiber goal: > 25 grams/day.  3. Mixed hyperlipidemia Course: Not at goal. Lipid-lowering medications: None.   Plan: Dietary changes: Increase soluble fiber, decrease simple  carbohydrates, decrease saturated fat. Exercise changes: Moderate to vigorous-intensity aerobic activity 150 minutes per week or as tolerated. We will continue to monitor along with PCP/specialists as it pertains to her weight loss journey.  Lab Results  Component Value Date   ALT 28 02/15/2013   AST 28 02/15/2013   ALKPHOS 64 02/15/2013   BILITOT 0.4 02/15/2013   The 10-year ASCVD risk score (Arnett DK, et al., 2019) is: 1.7%   Values used to calculate the score:     Age: 41 years     Sex: Female     Is Non-Hispanic African American: No     Diabetic: Yes     Tobacco smoker: No     Systolic Blood Pressure: 119 mmHg     Is BP treated: No     HDL Cholesterol: 61 mg/dL     Total Cholesterol: 288 mg/dL  4. Insulin resistance Improving, but not optimized. Goal is HgbA1c < 5.7, fasting insulin closer to 5.  Medication: Wegovy 0.25 mg subcutaneously weekly.    Plan:  She will continue to focus on protein-rich, low simple carbohydrate foods. We reviewed the importance of hydration, regular exercise for stress reduction, and restorative sleep.   5. Obesity, current BMI 35.8  - Refill Semaglutide-Weight Management (WEGOVY) 0.25 MG/0.5ML SOAJ; Inject 0.25 mg into the skin once a week.  Dispense: 2 mL; Refill: 0 - Increase Semaglutide-Weight Management (WEGOVY) 0.5 MG/0.5ML SOAJ; Inject 0.5 mg into the skin once a week.  Dispense: 2 mL; Refill: 0  Course: Karen Norman is currently in the action stage of change. As such, her goal is to continue with weight loss efforts.   Nutrition goals: She has agreed to the  Category 2 Plan.   Exercise goals:  As is.  Behavioral modification strategies: increasing lean protein intake, decreasing simple carbohydrates, increasing vegetables, increasing water intake, and decreasing liquid calories.  Karen Norman has agreed to follow-up with our clinic in 3 weeks. She was informed of the importance of frequent follow-up visits to maximize her success with intensive  lifestyle modifications for her multiple health conditions.   Objective:   Blood pressure 119/78, pulse 78, temperature 98 F (36.7 C), temperature source Oral, height 5\' 4"  (1.626 m), weight 208 lb (94.3 kg), SpO2 100 %, unknown if currently breastfeeding. Body mass index is 35.7 kg/m.  General: Cooperative, alert, well developed, in no acute distress. HEENT: Conjunctivae and lids unremarkable. Cardiovascular: Regular rhythm.  Lungs: Normal work of breathing. Neurologic: No focal deficits.   Lab Results  Component Value Date   CREATININE 0.65 07/25/2015   BUN 8 07/25/2015   NA 135 07/25/2015   K 3.7 07/25/2015   CL 105 07/25/2015   CO2 19 (L) 07/25/2015   Lab Results  Component Value Date   ALT 28 02/15/2013   AST 28 02/15/2013   ALKPHOS 64 02/15/2013   BILITOT 0.4 02/15/2013   Lab Results  Component Value Date   WBC 9.8 07/26/2015   HGB 11.8 (L) 07/26/2015   HCT 35.8 (L) 07/26/2015   MCV 96.2 07/26/2015   PLT 197 07/26/2015   Attestation Statements:   Reviewed by clinician on day of visit: allergies, medications, problem list, medical history, surgical history, family history, social history, and previous encounter notes.  Time spent on visit including pre-visit chart review and post-visit care and documentation was 42 minutes. Time was spent on: Food choices and timing of food intake reviewed today. I discussed a personalized meal plan with the patient that will help her to lose weight and will improve her obesity-related conditions going forward. I performed a medically necessary appropriate examination and/or evaluation. I discussed the assessment and treatment plan with the patient. Motivational interviewing as well as evidence-based interventions for health behavior change were utilized today including the discussion of self monitoring techniques, problem-solving barriers and SMART goal setting techniques.  An exercise prescription was reviewed.  The patient was  provided an opportunity to ask questions and all were answered. The patient agreed with the plan and demonstrated an understanding of the instructions. Clinical information was updated and documented in the EMR.  I, 07/28/2015, CMA, am acting as Insurance claims handler for Energy manager, NP.  I have reviewed the above documentation for accuracy and completeness, and I agree with the above. -  William Hamburger, DO, MS, FAAFP, DABOM - Family and Bariatric Medicine.

## 2021-12-10 DIAGNOSIS — M7661 Achilles tendinitis, right leg: Secondary | ICD-10-CM | POA: Diagnosis not present

## 2021-12-10 MED ORDER — BETAMETHASONE SOD PHOS & ACET 6 (3-3) MG/ML IJ SUSP
3.0000 mg | Freq: Once | INTRAMUSCULAR | Status: AC
  Administered 2021-12-10: 3 mg via INTRA_ARTICULAR

## 2021-12-10 NOTE — Progress Notes (Signed)
° °  HPI: 41 y.o. female presenting today as a new patient for evaluation of right posterior heel pain has been going on for about 2 years intermittently.  Aggravated by wearing flat shoes or applying pressure.  She has been icing her foot and elevating it and taking pain medication for relief.  She does wear foot inserts and tries different shoes to help alleviate some of the pain.  She presents for further treatment evaluation  Past Medical History:  Diagnosis Date   Anxiety    B12 deficiency    Back pain    Constipation    Depression    Endometriosis    GERD (gastroesophageal reflux disease)    tums prn   Gestational diabetes mellitus, antepartum    Joint pain    Knee pain    Low energy    Lower extremity edema    Seasonal allergies    SVD (spontaneous vaginal delivery)    x 2   Vitamin D deficiency     Past Surgical History:  Procedure Laterality Date   CERVICAL CERCLAGE N/A 01/26/2015   Procedure: CERCLAGE CERVICAL;  Surgeon: Huel Cote, MD;  Location: [facility];  Service: Gynecology;  Laterality: N/A;   KNEE SURGERY     left   LAPAROTOMY     left ovary   WISDOM TOOTH EXTRACTION      No Known Allergies   Physical Exam: General: The patient is alert and oriented x3 in no acute distress.  Dermatology: Skin is warm, dry and supple bilateral lower extremities. Negative for open lesions or macerations.  Vascular: Palpable pedal pulses bilaterally. No edema or erythema noted. Capillary refill within normal limits.  Neurological: Epicritic and protective threshold grossly intact bilaterally.   Musculoskeletal Exam: Pain on palpation noted to the posterior medial tubercle of the right calcaneus at the insertion of the Achilles tendon consistent with retrocalcaneal bursitis. Range of motion within normal limits. Muscle strength 5/5 in all muscle groups bilateral lower extremities.  Radiographic Exam:  Plantar calcaneal spur noted to the respective calcaneus on lateral  view. No fracture or dislocation noted. Normal osseous mineralization noted.     Assessment: 1. Insertional Achilles tendinitis right  Plan of Care:  1. Patient was evaluated. Radiographs were reviewed today. 2. Injection of 0.5 mL Celestone Soluspan injected into the posterior medial aspect of the heel. 3.  Prescription for Medrol Dosepak 4.  Prescription for diclofenac 75 mg 2 times daily after completion of the Dosepak 5.  Recommend daily stretching exercises 6.  Return to clinic as needed   Felecia Shelling, DPM [facility]  Dr. Felecia Shelling, DPM    [address] [place], [place] [address]                Office [phone]  Fax [phone]

## 2021-12-24 ENCOUNTER — Other Ambulatory Visit (INDEPENDENT_AMBULATORY_CARE_PROVIDER_SITE_OTHER): Payer: Self-pay | Admitting: Family Medicine

## 2021-12-30 ENCOUNTER — Other Ambulatory Visit: Payer: Self-pay

## 2021-12-30 ENCOUNTER — Ambulatory Visit (INDEPENDENT_AMBULATORY_CARE_PROVIDER_SITE_OTHER): Payer: BC Managed Care – PPO | Admitting: Podiatry

## 2021-12-30 DIAGNOSIS — G8929 Other chronic pain: Secondary | ICD-10-CM

## 2021-12-30 DIAGNOSIS — M7661 Achilles tendinitis, right leg: Secondary | ICD-10-CM

## 2021-12-30 DIAGNOSIS — M79671 Pain in right foot: Secondary | ICD-10-CM | POA: Diagnosis not present

## 2021-12-30 NOTE — Progress Notes (Signed)
° °  HPI: 41 y.o. female presenting today for follow-up evaluation of right posterior heel pain this been going on about 2 years intermittently.  Patient states that the injection only helped for about a week.  She has been experiencing significant pain and tenderness to the right heel.  She says that also when she walks she hears an audible pop to the heel area.  She also completed the oral Medrol Dosepak.  She continues to take the diclofenac 2 times daily with minimal improvement she presents for follow-up treatment and evaluation.  Past Medical History:  Diagnosis Date   Anxiety    B12 deficiency    Back pain    Constipation    Depression    Endometriosis    GERD (gastroesophageal reflux disease)    tums prn   Gestational diabetes mellitus, antepartum    Joint pain    Knee pain    Low energy    Lower extremity edema    Seasonal allergies    SVD (spontaneous vaginal delivery)    x 2   Vitamin D deficiency     Past Surgical History:  Procedure Laterality Date   CERVICAL CERCLAGE N/A 01/26/2015   Procedure: CERCLAGE CERVICAL;  Surgeon: Huel Cote, MD;  Location: [facility];  Service: Gynecology;  Laterality: N/A;   KNEE SURGERY     left   LAPAROTOMY     left ovary   WISDOM TOOTH EXTRACTION      No Known Allergies   Physical Exam: General: The patient is alert and oriented x3 in no acute distress.  Dermatology: Skin is warm, dry and supple bilateral lower extremities. Negative for open lesions or macerations.  Vascular: Palpable pedal pulses bilaterally. No edema or erythema noted. Capillary refill within normal limits.  Neurological: Epicritic and protective threshold grossly intact bilaterally.   Musculoskeletal Exam: There continues to be pain on palpation noted to the posterior medial tubercle of the right calcaneus at the insertion of the Achilles tendon consistent with retrocalcaneal bursitis. Range of motion within normal limits. Muscle strength 5/5 in all muscle  groups bilateral lower extremities.  Assessment: 1. Insertional Achilles tendinitis right; medial aspect  Plan of Care:  1. Patient was evaluated. Radiographs were reviewed again today. 2.  Since the patient has not improved significantly and has been dealing with this for several years we are going to proceed with MRI of the right ankle/heel  3.  Continue diclofenac 75 mg 2 times daily  4.  Recommend daily stretching exercises 5.  Return to after MRI to review results and discuss further treatment options   Felecia Shelling, DPM [facility]  Dr. Felecia Shelling, DPM    [address] [place], [place] [address]                Office [phone]  Fax [phone]

## 2022-01-01 ENCOUNTER — Encounter (INDEPENDENT_AMBULATORY_CARE_PROVIDER_SITE_OTHER): Payer: Self-pay | Admitting: Family Medicine

## 2022-01-01 ENCOUNTER — Other Ambulatory Visit: Payer: Self-pay

## 2022-01-01 ENCOUNTER — Ambulatory Visit (INDEPENDENT_AMBULATORY_CARE_PROVIDER_SITE_OTHER): Payer: BC Managed Care – PPO | Admitting: Family Medicine

## 2022-01-01 VITALS — BP 116/81 | HR 79 | Temp 98.1°F | Ht 64.0 in | Wt 205.0 lb

## 2022-01-01 DIAGNOSIS — Z6835 Body mass index (BMI) 35.0-35.9, adult: Secondary | ICD-10-CM | POA: Diagnosis not present

## 2022-01-01 DIAGNOSIS — E669 Obesity, unspecified: Secondary | ICD-10-CM | POA: Diagnosis not present

## 2022-01-01 DIAGNOSIS — M7660 Achilles tendinitis, unspecified leg: Secondary | ICD-10-CM

## 2022-01-01 DIAGNOSIS — F3289 Other specified depressive episodes: Secondary | ICD-10-CM

## 2022-01-01 DIAGNOSIS — R632 Polyphagia: Secondary | ICD-10-CM | POA: Diagnosis not present

## 2022-01-02 MED ORDER — WEGOVY 0.5 MG/0.5ML ~~LOC~~ SOAJ: 0.5000 mg | SUBCUTANEOUS | 1 refills | Status: DC

## 2022-01-06 ENCOUNTER — Telehealth: Payer: Self-pay | Admitting: Podiatry

## 2022-01-06 NOTE — Telephone Encounter (Signed)
Left message for pt to call to schedule an appt. I was looking at 3.13.2023.Marland Kitchen

## 2022-01-06 NOTE — Progress Notes (Signed)
Chief Complaint:   OBESITY Karen Norman is here to discuss her progress with her obesity treatment plan along with follow-up of her obesity related diagnoses. See Medical Weight Management Flowsheet for complete bioelectrical impedance results.  Today's visit was #: 3 Starting weight: 213 lbs Starting date: 11/12/2021 Weight change since last visit: 3 lbs Total lbs lost to date: 8 lbs Total weight loss percentage to date: -3.76%  Nutrition Plan: Category 2 Plan for 80% of the time.  Activity: Increased walking. Anti-obesity medications: Wegovy 2.5 mg subcutaneously weekly. Reported side effects: None.  Interim History: Karen Norman has an MRI of her right ankle scheduled.  Assessment/Plan:   1. Polyphagia Not at goal. Current treatment: Wegovy 2.5 mg subcutaneously weekly.    Plan:  Wegovy 5 mg subcutaneously that has already been sent to the pharmacy. She will continue to focus on protein-rich, low simple carbohydrate foods. We reviewed the importance of hydration, regular exercise for stress reduction, and restorative sleep.  2. Achilles tendinitis, unspecified laterality Followed by Podiatry.  MRI pending.  3. Other depression, with emotional eating Improving, but not optimized. Medication: Prozac 40 mg daily.   Plan: Continue Prozac 40 mg daily.  Discussed cues and consequences, how thoughts affect eating, model of thoughts, feelings, and behaviors, and strategies for change by focusing on the cue. Discussed cognitive distortions, coping thoughts, and how to change your thoughts.  4. Obesity, current BMI 35.3  - Increase Semaglutide-Weight Management (WEGOVY) 0.5 MG/0.5ML SOAJ; Inject 0.5 mg into the skin once a week.  Dispense: 2 mL; Refill: 1  Course: Karen Norman is currently in the action stage of change. As such, her goal is to continue with weight loss efforts.   Nutrition goals: She has agreed to the Category 2 Plan.   Exercise goals:  As is.  Behavioral modification  strategies: increasing lean protein intake, decreasing simple carbohydrates, increasing vegetables, and increasing water intake.  Karen Norman has agreed to follow-up with our clinic in 4 weeks. She was informed of the importance of frequent follow-up visits to maximize her success with intensive lifestyle modifications for her multiple health conditions.   Objective:   Blood pressure 116/81, pulse 79, temperature 98.1 F (36.7 C), temperature source Oral, height 5\' 4"  (1.626 m), weight 205 lb (93 kg), SpO2 99 %, unknown if currently breastfeeding. Body mass index is 35.19 kg/m.  General: Cooperative, alert, well developed, in no acute distress. HEENT: Conjunctivae and lids unremarkable. Cardiovascular: Regular rhythm.  Lungs: Normal work of breathing. Neurologic: No focal deficits.   Lab Results  Component Value Date   CREATININE 1.0 07/22/2021   BUN 11 07/22/2021   NA 137 07/22/2021   K 4.0 07/22/2021   CL 100 07/22/2021   CO2 21 07/22/2021   Lab Results  Component Value Date   ALT 28 02/15/2013   AST 26 07/22/2021   ALKPHOS 124 07/22/2021   BILITOT 0.4 02/15/2013   Lab Results  Component Value Date   HGBA1C 5.5 07/22/2021   Lab Results  Component Value Date   INSULIN 9.5 11/12/2021   Lab Results  Component Value Date   TSH 3.41 07/22/2021   Lab Results  Component Value Date   CHOL 288 (H) 11/12/2021   HDL 61 11/12/2021   LDLCALC 181 (H) 11/12/2021   TRIG 242 (H) 11/12/2021   CHOLHDL 4.7 (H) 11/12/2021   Lab Results  Component Value Date   VD25OH 36.6 07/22/2021   Lab Results  Component Value Date   WBC 9.3 07/22/2021  HGB 12.7 07/22/2021   HCT 38 07/22/2021   MCV 96.2 07/26/2015   PLT 323 07/22/2021   Lab Results  Component Value Date   IRON 7 07/22/2021   TIBC 358 07/22/2021   FERRITIN 34 11/12/2021   Attestation Statements:   Reviewed by clinician on day of visit: allergies, medications, problem list, medical history, surgical history, family  history, social history, and previous encounter notes.  I, Insurance claims handler, CMA, am acting as transcriptionist for Helane Rima, DO  I have reviewed the above documentation for accuracy and completeness, and I agree with the above. -  Helane Rima, DO, MS, FAAFP, DABOM - Family and Bariatric Medicine.

## 2022-01-13 ENCOUNTER — Encounter: Payer: Self-pay | Admitting: Podiatry

## 2022-01-14 ENCOUNTER — Ambulatory Visit
Admission: RE | Admit: 2022-01-14 | Discharge: 2022-01-14 | Disposition: A | Discharge status: Home / Self Care | Payer: BC Managed Care – PPO | Source: Ambulatory Visit | Attending: Podiatry | Admitting: Podiatry

## 2022-01-14 ENCOUNTER — Other Ambulatory Visit: Payer: Self-pay

## 2022-01-14 DIAGNOSIS — G8929 Other chronic pain: Secondary | ICD-10-CM

## 2022-01-14 DIAGNOSIS — M7661 Achilles tendinitis, right leg: Secondary | ICD-10-CM

## 2022-01-14 DIAGNOSIS — M79671 Pain in right foot: Secondary | ICD-10-CM

## 2022-01-20 ENCOUNTER — Ambulatory Visit (INDEPENDENT_AMBULATORY_CARE_PROVIDER_SITE_OTHER): Payer: BC Managed Care – PPO | Admitting: Podiatry

## 2022-01-20 ENCOUNTER — Other Ambulatory Visit: Payer: Self-pay

## 2022-01-20 DIAGNOSIS — G8929 Other chronic pain: Secondary | ICD-10-CM

## 2022-01-20 DIAGNOSIS — M7661 Achilles tendinitis, right leg: Secondary | ICD-10-CM | POA: Diagnosis not present

## 2022-01-20 DIAGNOSIS — M79671 Pain in right foot: Secondary | ICD-10-CM | POA: Diagnosis not present

## 2022-01-20 NOTE — Progress Notes (Signed)
? ?  HPI: 41 y.o. female presenting today for follow-up evaluation of right posterior heel pain this been going on about 2 years intermittently.  No change since last visit.  She presents today to review the MRI results and discuss further treatment options ? ?Past Medical History:  ?Diagnosis Date  ? Anxiety   ? B12 deficiency   ? Back pain   ? Constipation   ? Depression   ? Endometriosis   ? GERD (gastroesophageal reflux disease)   ? tums prn  ? Gestational diabetes mellitus, antepartum   ? Joint pain   ? Knee pain   ? Low energy   ? Lower extremity edema   ? Seasonal allergies   ? SVD (spontaneous vaginal delivery)   ? x 2  ? Vitamin D deficiency   ? ? ?Past Surgical History:  ?Procedure Laterality Date  ? CERVICAL CERCLAGE N/A 01/26/2015  ? Procedure: CERCLAGE CERVICAL;  Surgeon: Huel Cote, MD;  Location: [facility];  Service: Gynecology;  Laterality: N/A;  ? KNEE SURGERY    ? left  ? LAPAROTOMY    ? left ovary  ? WISDOM TOOTH EXTRACTION    ? ? ?No Known Allergies ?  ?Physical Exam: ?General: The patient is alert and oriented x3 in no acute distress. ? ?Dermatology: Skin is warm, dry and supple bilateral lower extremities. Negative for open lesions or macerations. ? ?Vascular: Palpable pedal pulses bilaterally. No edema or erythema noted. Capillary refill within normal limits. ? ?Neurological: Epicritic and protective threshold grossly intact bilaterally.  ? ?MR ANKLE RT WO CONTRAST 01/14/2022: ?IMPRESSION: ?1. Mild plantar fasciitis of the medial band of the plantar fascia ?at the calcaneal insertion without a tear. ?2. 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 ?3. Mild peroneal tenosynovitis. ?4. Mild posterior tibial and flexor digitorum tenosynovitis. ? ?Assessment: ?1. Insertional Achilles tendinitis right; medial aspect ?2.  Chronic heel pain right ? ?Plan of Care:  ?1. Patient was evaluated.  MRI reviewed today  ?2.  The unfortunately the MRI was  noncontributory to the patient's symptoms.  There is no posterior ankle impingement, no pain along palpation of the posterior tibial or peroneal tendons, and no pain along the plantar fascia.  The patient's pain is localized to the plantar medial aspect of the far posterior tubercle of the calcaneus. ?3.  Continue diclofenac 75 mg 2 times daily as needed ?4.  Appointment with Pedorthist for custom molded orthotics ?5.  Return to clinic for orthotics fitting.  As needed with me ? ? ? ?Felecia Shelling, DPM ?[facility] ? ?Dr. Felecia Shelling, DPM  ?  ?[address]                                      ?[place], [place] [address]                ?Office [phone]  ?Fax [phone] ? ? ? ? ? ?

## 2022-01-21 ENCOUNTER — Other Ambulatory Visit: Payer: BC Managed Care – PPO

## 2022-02-02 ENCOUNTER — Other Ambulatory Visit: Payer: Self-pay | Admitting: Podiatry

## 2022-02-03 ENCOUNTER — Encounter (INDEPENDENT_AMBULATORY_CARE_PROVIDER_SITE_OTHER): Payer: Self-pay | Admitting: Family Medicine

## 2022-02-03 ENCOUNTER — Other Ambulatory Visit: Payer: Self-pay

## 2022-02-03 ENCOUNTER — Ambulatory Visit (INDEPENDENT_AMBULATORY_CARE_PROVIDER_SITE_OTHER): Payer: BC Managed Care – PPO | Admitting: Family Medicine

## 2022-02-03 VITALS — BP 110/76 | HR 74 | Temp 97.9°F | Ht 64.0 in | Wt 202.0 lb

## 2022-02-03 DIAGNOSIS — R632 Polyphagia: Secondary | ICD-10-CM | POA: Diagnosis not present

## 2022-02-03 DIAGNOSIS — E669 Obesity, unspecified: Secondary | ICD-10-CM | POA: Diagnosis not present

## 2022-02-03 DIAGNOSIS — M25571 Pain in right ankle and joints of right foot: Secondary | ICD-10-CM

## 2022-02-03 DIAGNOSIS — Z6834 Body mass index (BMI) 34.0-34.9, adult: Secondary | ICD-10-CM

## 2022-02-03 DIAGNOSIS — R609 Edema, unspecified: Secondary | ICD-10-CM | POA: Diagnosis not present

## 2022-02-03 MED ORDER — WEGOVY 0.5 MG/0.5ML ~~LOC~~ SOAJ: 0.5000 mg | SUBCUTANEOUS | 0 refills | Status: AC

## 2022-02-03 MED ORDER — WEGOVY 1 MG/0.5ML ~~LOC~~ SOAJ: 1.0000 mg | SUBCUTANEOUS | 1 refills | Status: AC

## 2022-02-03 MED ORDER — SPIRONOLACTONE 25 MG PO TABS: 25.0000 mg | ORAL_TABLET | Freq: Every day | ORAL | 0 refills | Status: AC

## 2022-02-04 ENCOUNTER — Other Ambulatory Visit: Payer: BC Managed Care – PPO

## 2022-02-05 ENCOUNTER — Ambulatory Visit: Payer: BC Managed Care – PPO | Admitting: Family Medicine

## 2022-02-05 ENCOUNTER — Ambulatory Visit: Payer: Self-pay

## 2022-02-05 VITALS — BP 122/86 | HR 73 | Ht 64.0 in

## 2022-02-05 DIAGNOSIS — M79671 Pain in right foot: Secondary | ICD-10-CM | POA: Diagnosis not present

## 2022-02-05 DIAGNOSIS — Z6834 Body mass index (BMI) 34.0-34.9, adult: Secondary | ICD-10-CM

## 2022-02-05 DIAGNOSIS — M7661 Achilles tendinitis, right leg: Secondary | ICD-10-CM | POA: Diagnosis not present

## 2022-02-05 MED ORDER — NITROGLYCERIN 0.2 MG/HR TD PT24: MEDICATED_PATCH | TRANSDERMAL | 1 refills | Status: AC

## 2022-02-05 NOTE — Patient Instructions (Addendum)
Thank you for coming in today.  ? ?Nitroglycerin Protocol ?Apply 1/4 nitroglycerin patch to affected area daily. ?Change position of patch within the affected area every 24 hours. ?You may experience a headache during the first 1-2 weeks of using the patch, these should subside. ?If you experience headaches after beginning nitroglycerin patch treatment, you may take your preferred over the counter pain reliever. ?Another side effect of the nitroglycerin patch is skin irritation or rash related to patch adhesive. ?Please notify our office if you develop more severe headaches or rash, and stop the patch. ?Tendon healing with nitroglycerin patch may require 12 to 24 weeks depending on the extent of injury. ?Men should not use if taking Viagra, Cialis, or Levitra.  ?Do not use if you have migraines or rosacea.   ? ?Work on the eccentric exercises ? ?Recheck back in 6 weeks ?

## 2022-02-05 NOTE — Progress Notes (Signed)
? ?I, Karen Norman, am serving as a Neurosurgeon for Dr. Clementeen Graham. ? ?Subjective:   ? ?CC: R ankle pain ? ?HPI: Pt is a 41 y/o female c/o R ankle pain ongoing intermittently for 2 years. Pt was previously seen by [facility] on 01/20/22 for R posterior heel pain and R ankle MRI review the only thing that [facility] could do was shoe inserts.  Pt locates pain to the back of heel and ankle, wears boot or a heel to help with the pain in the heel. When trying to wear a flat shoe or tennis shoe the pain is worse it feels like it is stretching. Patient was telling Dr. Earlene Plater and wanted patient to see sports medicine to help further the diagnosis.  ? ?R ankle swelling: no ?Aggravates: walking especially in a tennis shoe or flat shoe ?Treatments tried: NSAIDS, compressions, tylenol, heat, ice, Topical,  had had cortisone injections and prednisone pack and a prescribed higher NSAID thinks Meloxicam. ? ?Dx imaging: 01/14/22 R ankle MRI ?12/04/21 R foot XR ? ?Pertinent review of Systems: No fevers or chills ? ?Relevant historical information: Gestational diabetes. ? ? ?Objective:   ? ?Vitals:  ? 02/05/22 1555  ?BP: 122/86  ?Pulse: 73  ?SpO2: 99%  ? ?General: Well Developed, well nourished, and in no acute distress.  ? ?MSK: Right foot and ankle generally normal-appearing ?Tender palpation at distal Achilles tendon.  Nontender at posterior tibialis tendon and peroneal tendons.  Normal foot and ankle motion.  Some pain with resisted foot plantarflexion. ? ?Lab and Radiology Results ? ?EXAM: ?MRI OF THE RIGHT ANKLE WITHOUT CONTRAST ?  ?TECHNIQUE: ?Multiplanar, multisequence MR imaging of the ankle was performed. No ?intravenous contrast was administered. ?  ?COMPARISON:  None. ?  ?FINDINGS: ?TENDONS ?  ?Peroneal: Peroneal longus tendon intact. Peroneal brevis intact. ?Mild peroneal tenosynovitis. ?  ?Posteromedial: Posterior tibial tendon intact. Flexor hallucis ?longus tendon intact. Flexor digitorum longus tendon  intact. Small ?amount of fluid in the posterior tibial tendon sheath and flexor ?digitorum tendon sheath as can be seen with tenosynovitis. ?  ?Anterior: Tibialis anterior tendon intact. Extensor hallucis longus ?tendon intact Extensor digitorum longus tendon intact. ?  ?Achilles:  Intact. ?  ?Plantar Fascia: Mild thickening of the medial band of the plantar ?fascia at the calcaneal insertion concerning for mild plantar ?fasciitis. No tear of the plantar fascia. ?  ?LIGAMENTS ?  ?Lateral: Anterior talofibular ligament intact. Calcaneofibular ?ligament intact. Posterior talofibular ligament intact. Anterior and ?posterior tibiofibular ligaments intact. ?  ?Medial: Deltoid ligament intact. Spring ligament intact. ?  ?CARTILAGE ?  ?Ankle Joint: No joint effusion. Partial-thickness cartilage loss of ?the posterior tibial plafond with subchondral reactive marrow ?changes. 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 ?  ?Subtalar Joints/Sinus Tarsi: Normal subtalar joints. No subtalar ?joint effusion. Normal sinus tarsi. ?  ?Bones: No aggressive osseous lesion.  No fracture or dislocation. ?  ?Soft Tissue: No fluid collection or hematoma. Muscles are normal ?without edema or atrophy. Tarsal tunnel is normal. ?  ?IMPRESSION: ?1. Mild plantar fasciitis of the medial band of the plantar fascia ?at the calcaneal insertion without a tear. ?2. 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 ?3. Mild peroneal tenosynovitis. ?4. Mild posterior tibial and flexor digitorum tenosynovitis. ?  ?  ?Electronically Signed ?  By: Elige Ko M.D. ?  On: 01/15/2022 06:42 ?Gary Fleet  Denyse Amass, personally (independently) visualized and performed the interpretation of the images attached in this note. ? ? ? ? ?Impression and Recommendations:   ? ?Assessment and Plan: ?41 y.o. female with left posterior heel and plantar heel pain at plantar fascia  and Achilles tendon.  She has had good trials of treatment so far with podiatry with injections and oral prednisone.  This has not provided lasting benefit.  She is currently in the process of getting set up for orthotics which are a good idea.  We will add exercises to this.  Eccentric exercises taught in clinic today.  We will add also nitroglycerin patch protocol to the Achilles tendon.  Although she does have evidence of peroneal and posterior tibialis tendinitis on MRI she is not painful in this area so I do not think is clinically significant. ?If not improving on her own with home exercise program and nitroglycerin patch protocol she will let me know and I can refer to physical therapy.  Check back in 6 weeks.. ? ?PDMP not reviewed this encounter. ?Orders Placed This Encounter  ?Procedures  ? [place] LIMITED JOINT SPACE STRUCTURES LOW RIGHT(NO LINKED CHARGES)  ?  Standing Status:   Future  ?  Number of Occurrences:   1  ?  Standing Expiration Date:   02/06/2023  ?  Order Specific Question:   Reason for Exam (SYMPTOM  OR DIAGNOSIS REQUIRED)  ?  Answer:   right heel pain  ?  Order Specific Question:   Preferred imaging location?  ?  Answer:   Adult nurse [facility]  ? ?Meds ordered this encounter  ?Medications  ? nitroGLYCERIN (NITRODUR - DOSED IN MG/24 HR) 0.2 mg/hr patch  ?  Sig: Apply 1/4 patch daily to tendon for tendonitis.  ?  Dispense:  30 patch  ?  Refill:  1  ? ? ?Discussed warning signs or symptoms. Please see discharge instructions. Patient expresses understanding. ? ? ?The above documentation has been reviewed and is accurate and complete Clementeen Graham, M.D. ? ?

## 2022-02-06 ENCOUNTER — Encounter: Payer: Self-pay | Admitting: Family Medicine

## 2022-02-06 DIAGNOSIS — Z6834 Body mass index (BMI) 34.0-34.9, adult: Secondary | ICD-10-CM | POA: Insufficient documentation

## 2022-02-10 NOTE — Progress Notes (Signed)
Chief Complaint:   OBESITY Karen Norman is here to discuss her progress with her obesity treatment plan along with follow-up of her obesity related diagnoses. See Medical Weight Management Flowsheet for complete bioelectrical impedance results.  Today's visit was #: 4 Starting weight: 213 lbs Starting date: 11/12/2021 Weight change since last visit: 3 lbs Total lbs lost to date: 11 lbs Total weight loss percentage to date: -5.16%  Nutrition Plan: Category 2 Meal Plan and keeping a food journal and adhering to recommended goals of 1200 calories and 90 grams of protein daily for 90% of the time. Activity: Walking for 15 minutes 4 times per week. Anti-obesity medications: Wegovy 0.5 mg subcutaneously weekly. Reported side effects: None.  Interim History: [name] says she "feels puffy."  She continues to have joint pain in multiple joints.  She says the MRI was "not helpful."  She says her energy is better since starting B12 and iron.  Assessment/Plan:   1. Right ankle pain, chronic Currently followed by Podiatry. Tendonitis. Recent MRI reviewed.  Will refer to Sports Medicine.  - Ambulatory referral to Sports Medicine  2. Edema Amarah will start spironolactone 25-50 mg daily for edema.  - Start spironolactone (ALDACTONE) 25 MG tablet; Take 1-2 tablets (25-50 mg total) by mouth daily.  Dispense: 60 tablet; Refill: 0  3. Polyphagia Not at goal. Current treatment: Wegovy 0.5 mg subcutaneously weekly.    Plan: She will continue to focus on protein-rich, low simple carbohydrate foods. We reviewed the importance of hydration, regular exercise for stress reduction, and restorative sleep.  - Refill Semaglutide-Weight Management (WEGOVY) 0.5 MG/0.5ML SOAJ; Inject 0.5 mg into the skin once a week.  Dispense: 2 mL; Refill: 0  Will hold until patient calls. - Semaglutide-Weight Management (WEGOVY) 1 MG/0.5ML SOAJ; Inject 1 mg into the skin once a week.  Dispense: 2 mL; Refill: 1  4. Obesity,  current BMI 34.7  - Semaglutide-Weight Management (WEGOVY) 0.5 MG/0.5ML SOAJ; Inject 0.5 mg into the skin once a week.  Dispense: 2 mL; Refill: 0 - Semaglutide-Weight Management (WEGOVY) 1 MG/0.5ML SOAJ; Inject 1 mg into the skin once a week.  Dispense: 2 mL; Refill: 1  Course: [name] is currently in the action stage of change. As such, her goal is to continue with weight loss efforts.   Nutrition goals: She has agreed to the Category 2 Plan and keeping a food journal and adhering to recommended goals of 1200 calories and 90 grams of protein.   Exercise goals:  As is.  Behavioral modification strategies: increasing lean protein intake, decreasing simple carbohydrates, increasing vegetables, and increasing water intake.  [name] has agreed to follow-up with our clinic in 6 weeks. She was informed of the importance of frequent follow-up visits to maximize her success with intensive lifestyle modifications for her multiple health conditions.   Objective:   Blood pressure 110/76, pulse 74, temperature 97.9 F (36.6 C), temperature source Oral, height 5\' 4"  (1.626 m), weight 202 lb (91.6 kg), SpO2 99 %, unknown if currently breastfeeding. Body mass index is 34.67 kg/m.  General: Cooperative, alert, well developed, in no acute distress. HEENT: Conjunctivae and lids unremarkable. Cardiovascular: Regular rhythm.  Lungs: Normal work of breathing. Neurologic: No focal deficits.   Lab Results  Component Value Date   CREATININE 1.0 07/22/2021   BUN 11 07/22/2021   NA 137 07/22/2021   K 4.0 07/22/2021   CL 100 07/22/2021   CO2 21 07/22/2021   Lab Results  Component Value Date  ALT 28 02/15/2013   AST 26 07/22/2021   ALKPHOS 124 07/22/2021   BILITOT 0.4 02/15/2013   Lab Results  Component Value Date   HGBA1C 5.5 07/22/2021   Lab Results  Component Value Date   INSULIN 9.5 11/12/2021   Lab Results  Component Value Date   TSH 3.41 07/22/2021   Lab Results  Component Value  Date   CHOL 288 (H) 11/12/2021   HDL 61 11/12/2021   LDLCALC 181 (H) 11/12/2021   TRIG 242 (H) 11/12/2021   CHOLHDL 4.7 (H) 11/12/2021   Lab Results  Component Value Date   VD25OH 36.6 07/22/2021   Lab Results  Component Value Date   WBC 9.3 07/22/2021   HGB 12.7 07/22/2021   HCT 38 07/22/2021   MCV 96.2 07/26/2015   PLT 323 07/22/2021   Lab Results  Component Value Date   IRON 7 07/22/2021   TIBC 358 07/22/2021   FERRITIN 34 11/12/2021   Attestation Statements:   Reviewed by clinician on day of visit: allergies, medications, problem list, medical history, surgical history, family history, social history, and previous encounter notes.  I, Insurance claims handler, CMA, am acting as transcriptionist for Helane Rima, DO  I have reviewed the above documentation for accuracy and completeness, and I agree with the above. -  Helane Rima, DO, MS, FAAFP, DABOM - Family and Bariatric Medicine.

## 2022-02-15 ENCOUNTER — Other Ambulatory Visit: Payer: Self-pay | Admitting: Adult Health

## 2022-02-15 DIAGNOSIS — F509 Eating disorder, unspecified: Secondary | ICD-10-CM

## 2022-02-15 DIAGNOSIS — F411 Generalized anxiety disorder: Secondary | ICD-10-CM

## 2022-02-15 DIAGNOSIS — F331 Major depressive disorder, recurrent, moderate: Secondary | ICD-10-CM

## 2022-02-17 ENCOUNTER — Encounter (INDEPENDENT_AMBULATORY_CARE_PROVIDER_SITE_OTHER): Payer: Self-pay | Admitting: Family Medicine

## 2022-02-17 DIAGNOSIS — K219 Gastro-esophageal reflux disease without esophagitis: Secondary | ICD-10-CM

## 2022-02-17 MED ORDER — PANTOPRAZOLE SODIUM 40 MG PO TBEC: 40.0000 mg | DELAYED_RELEASE_TABLET | Freq: Two times a day (BID) | ORAL | 0 refills | Status: DC

## 2022-03-14 ENCOUNTER — Telehealth: Payer: Self-pay | Admitting: *Deleted

## 2022-03-14 NOTE — Telephone Encounter (Signed)
Patient calling to say that the prior authorization for study (MRI right ankle w/o contrast)was not completed. ?[employer] and they gave me the approval prior authorization which was: [id number] from 01/10/22-02/08/22,contact person [place]. ?Called the patient, no answer, left this  vmessage. ?

## 2022-03-19 ENCOUNTER — Ambulatory Visit: Payer: BC Managed Care – PPO | Admitting: Family Medicine

## 2022-05-12 ENCOUNTER — Other Ambulatory Visit: Payer: Self-pay | Admitting: Podiatry

## 2022-05-16 NOTE — Telephone Encounter (Signed)
Please advise 

## 2022-06-18 ENCOUNTER — Encounter (INDEPENDENT_AMBULATORY_CARE_PROVIDER_SITE_OTHER): Payer: Self-pay

## 2022-07-07 ENCOUNTER — Encounter: Payer: Self-pay | Admitting: Adult Health

## 2022-07-07 ENCOUNTER — Ambulatory Visit (INDEPENDENT_AMBULATORY_CARE_PROVIDER_SITE_OTHER): Payer: BC Managed Care – PPO | Admitting: Adult Health

## 2022-07-07 DIAGNOSIS — F331 Major depressive disorder, recurrent, moderate: Secondary | ICD-10-CM

## 2022-07-07 DIAGNOSIS — F411 Generalized anxiety disorder: Secondary | ICD-10-CM | POA: Diagnosis not present

## 2022-07-07 MED ORDER — FLUOXETINE HCL 40 MG PO CAPS: 40.0000 mg | ORAL_CAPSULE | Freq: Every day | ORAL | 3 refills | Status: DC

## 2022-07-07 NOTE — Progress Notes (Signed)
Karen Norman [id number] 1981-07-12 40 y.o.  Subjective:   Patient ID:  Karen Norman is a 41 y.o. (DOB 1981/05/05) female.  Chief Complaint: No chief complaint on file.   HPI Karen Norman presents to the office today for follow-up of MDD and GAD  Describes mood today as "ok". Pleasant. Mood symptoms - denies depression, anxiety, and irritability. Mood is consistent. Stating "I'm doing alright". Feels like Prozac continues to work well. Stable interest and motivation. Taking medications as prescribed.  Energy levels stable. Active, does not have a regular exercise routine.  Enjoys some usual interests and activities. Married. Lives with husband and 3 children - 2 boys and one girl - 85, 38, and 7. Spending time with family.  Appetite adequate. Weight loss 30 pounds with Wegovy. Sleeps well most nights. Averages 6 to 8 hours.  Focus and concentration stable. Completing tasks. Managing aspects of household. 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  Denies AH or VH.    PHQ2-9    Flowsheet Row Office Visit from 11/12/2021 in [facility] Nutrition from 05/23/2015 in [facility]  PHQ-2 Total Score 6 0  PHQ-9 Total Score 22 --        Review of Systems:  Review of Systems  Musculoskeletal:  Negative for gait problem.  Neurological:  Negative for tremors.  Psychiatric/Behavioral:         Please refer to HPI    Medications: I have reviewed the patient's current medications.  Current Outpatient Medications  Medication Sig Dispense Refill   cetirizine (ZYRTEC) 10 MG tablet Take 10 mg by mouth daily.     Cholecalciferol (D3 VITAMIN PO) Take by mouth.     cyanocobalamin (,VITAMIN B-12,) 1000 MCG/ML injection Inject into the muscle.     diclofenac (VOLTAREN) 75 MG EC tablet TAKE 1 TABLET BY MOUTH TWICE A DAY 60 tablet 1   FLUoxetine (PROZAC) 40 MG capsule Take 1 capsule (40 mg total) by mouth daily. 90 capsule 3    nitroGLYCERIN (NITRODUR - DOSED IN MG/24 HR) 0.2 mg/hr patch Apply 1/4 patch daily to tendon for tendonitis. 30 patch 1   pantoprazole (PROTONIX) 40 MG tablet Take 1 tablet (40 mg total) by mouth 2 (two) times daily before a meal. 180 tablet 0   Semaglutide-Weight Management (WEGOVY) 0.5 MG/0.5ML SOAJ Inject 0.5 mg into the skin once a week. 2 mL 0   Semaglutide-Weight Management (WEGOVY) 1 MG/0.5ML SOAJ Inject 1 mg into the skin once a week. 2 mL 1   spironolactone (ALDACTONE) 25 MG tablet Take 1-2 tablets (25-50 mg total) by mouth daily. 60 tablet 0   No current facility-administered medications for this visit.    Medication Side Effects: None  Allergies: No Known Allergies  Past Medical History:  Diagnosis Date   Anxiety    B12 deficiency    Back pain    Constipation    Depression    Endometriosis    GERD (gastroesophageal reflux disease)    tums prn   Gestational diabetes mellitus, antepartum    Joint pain    Knee pain    Low energy    Lower extremity edema    Seasonal allergies    SVD (spontaneous vaginal delivery)    x 2   Vitamin D deficiency     Past Medical History, Surgical history, Social history, and Family history were reviewed and updated as appropriate.   Please see review of systems for further  details on the patient's review from today.   Objective:   Physical Exam:  There were no vitals taken for this visit.  Physical Exam Constitutional:      General: She is not in acute distress. Musculoskeletal:        General: No deformity.  Neurological:     Mental Status: She is alert and oriented to person, place, and time.     Coordination: Coordination normal.  Psychiatric:        Attention and Perception: Attention and perception normal. She does not perceive auditory or visual hallucinations.        Mood and Affect: Mood normal. Mood is not anxious or depressed. Affect is not labile, blunt, angry or inappropriate.        Speech: Speech normal.         Behavior: Behavior normal.        Thought Content: Thought content normal. Thought content is not paranoid or delusional. Thought content does not include homicidal or suicidal ideation. Thought content does not include homicidal or suicidal plan.        Cognition and Memory: Cognition and memory normal.        Judgment: Judgment normal.     Comments: Insight intact     Lab Review:     Component Value Date/Time   NA 137 07/22/2021 0000   K 4.0 07/22/2021 0000   CL 100 07/22/2021 0000   CO2 21 07/22/2021 0000   GLUCOSE 80 07/25/2015 0745   BUN 11 07/22/2021 0000   CREATININE 1.0 07/22/2021 0000   CREATININE 0.65 07/25/2015 0745   CREATININE 1.15 (H) 02/15/2013 1100   CALCIUM 9.1 07/22/2021 0000   PROT 7.6 02/15/2013 1100   ALBUMIN 4.0 07/22/2021 0000   AST 26 07/22/2021 0000   ALT 28 02/15/2013 1100   ALKPHOS 124 07/22/2021 0000   BILITOT 0.4 02/15/2013 1100   GFRNONAA >60 07/25/2015 0745   GFRAA >60 07/25/2015 0745       Component Value Date/Time   WBC 9.3 07/22/2021 0000   WBC 9.8 07/26/2015 0543   RBC 4.18 07/22/2021 0000   HGB 12.7 07/22/2021 0000   HCT 38 07/22/2021 0000   PLT 323 07/22/2021 0000   MCV 96.2 07/26/2015 0543   MCV 96.1 02/15/2013 1102   MCH 31.7 07/26/2015 0543   MCHC 33.0 07/26/2015 0543   RDW 14.3 07/26/2015 0543    No results found for: "POCLITH", "LITHIUM"   No results found for: "PHENYTOIN", "PHENOBARB", "VALPROATE", "CBMZ"   .res Assessment: Plan:    Plan:  1. Continue Prozac 40mg  daily   RTC 1 year   Time spent with patient was 15 minutes. Greater than 50% of face to face time with patient was spent on counseling and coordination of care.    Patient advised to contact office with any questions, adverse effects, or acute worsening in signs and symptoms.  Discussed potential benefits, risk, and side effects of benzodiazepines to include potential risk of tolerance and dependence, as well as possible drowsiness.  Advised  patient not to drive if experiencing drowsiness and to take lowest possible effective dose to minimize risk of dependence and tolerance.  Discussed potential benefits, risks, and side effects of stimulants with patient to include increased heart rate, palpitations, insomnia, increased anxiety, increased irritability, or decreased appetite.  Instructed patient to contact office if experiencing any significant tolerability issues.  Diagnoses and all orders for this visit:  Major depressive disorder, recurrent episode, moderate (HCC) -  FLUoxetine (PROZAC) 40 MG capsule; Take 1 capsule (40 mg total) by mouth daily.  Generalized anxiety disorder -     FLUoxetine (PROZAC) 40 MG capsule; Take 1 capsule (40 mg total) by mouth daily.     Please see After Visit Summary for patient specific instructions.  Future Appointments  Date Time Provider Department Center  07/08/2023  4:20 PM Jarielys Girardot, Thereasa Solo, NP CP-CP None     No orders of the defined types were placed in this encounter.   -------------------------------

## 2022-08-12 ENCOUNTER — Other Ambulatory Visit (HOSPITAL_COMMUNITY): Payer: Self-pay

## 2022-08-12 MED ORDER — WEGOVY 1 MG/0.5ML ~~LOC~~ SOAJ
0.5000 mL | SUBCUTANEOUS | 0 refills | Status: AC
  Filled 2022-08-12: qty 2, 28d supply, fill #0

## 2022-08-12 MED ORDER — WEGOVY 1.7 MG/0.75ML ~~LOC~~ SOAJ
1.7000 mg | SUBCUTANEOUS | 0 refills | Status: AC
  Filled 2022-08-12: qty 3, 28d supply, fill #0

## 2022-08-14 ENCOUNTER — Other Ambulatory Visit (HOSPITAL_COMMUNITY): Payer: Self-pay

## 2022-08-15 ENCOUNTER — Other Ambulatory Visit (HOSPITAL_COMMUNITY): Payer: Self-pay

## 2022-08-18 ENCOUNTER — Other Ambulatory Visit (HOSPITAL_COMMUNITY): Payer: Self-pay

## 2022-08-21 ENCOUNTER — Other Ambulatory Visit (HOSPITAL_COMMUNITY): Payer: Self-pay

## 2022-09-07 ENCOUNTER — Other Ambulatory Visit: Payer: Self-pay | Admitting: Podiatry

## 2022-09-18 ENCOUNTER — Other Ambulatory Visit (HOSPITAL_COMMUNITY): Payer: Self-pay

## 2022-10-01 ENCOUNTER — Other Ambulatory Visit (HOSPITAL_COMMUNITY): Payer: Self-pay

## 2023-05-20 ENCOUNTER — Other Ambulatory Visit: Payer: Self-pay | Admitting: Adult Health

## 2023-05-20 DIAGNOSIS — F411 Generalized anxiety disorder: Secondary | ICD-10-CM

## 2023-05-20 DIAGNOSIS — F331 Major depressive disorder, recurrent, moderate: Secondary | ICD-10-CM

## 2023-07-08 ENCOUNTER — Ambulatory Visit: Payer: BC Managed Care – PPO | Admitting: Adult Health

## 2023-07-17 ENCOUNTER — Ambulatory Visit (INDEPENDENT_AMBULATORY_CARE_PROVIDER_SITE_OTHER): Payer: Self-pay | Admitting: Adult Health

## 2023-07-17 DIAGNOSIS — Z0389 Encounter for observation for other suspected diseases and conditions ruled out: Secondary | ICD-10-CM

## 2023-07-17 NOTE — Progress Notes (Deleted)
Karen Norman [id number] 04-Dec-1980 42 y.o.  Subjective:   Patient ID:  Karen Norman is a 42 y.o. (DOB [birth year]) female.  Chief Complaint: No chief complaint on file.   HPI Karen Norman presents to the office today for follow-up of MDD and GAD  Describes mood today as "ok". Pleasant. Mood symptoms - denies depression, anxiety, and irritability. Mood is consistent. Stating "I'm doing alright". Feels like Prozac continues to work well. Stable interest and motivation. Taking medications as prescribed.  Energy levels stable. Active, does not have a regular exercise routine.  Enjoys some usual interests and activities. Married. Lives with husband and 3 children - 2 boys and one girl - 80, 109, and 7. Spending time with family.  Appetite adequate. Weight loss 30 pounds with Wegovy. Sleeps well most nights. Averages 6 to 8 hours.  Focus and concentration stable. Completing tasks. Managing aspects of household. 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  Denies AH or VH.   PHQ2-9    Flowsheet Row Office Visit from 11/12/2021 in [facility] Nutrition from 05/23/2015 in [facility] at [facility] Total Score 6 0  PHQ-9 Total Score 22 --        Review of Systems:  Review of Systems  Medications: {medication reviewed/display:3041432}  Current Outpatient Medications  Medication Sig Dispense Refill   cetirizine (ZYRTEC) 10 MG tablet Take 10 mg by mouth daily.     Cholecalciferol (D3 VITAMIN PO) Take by mouth.     cyanocobalamin (,VITAMIN B-12,) 1000 MCG/ML injection Inject into the muscle.     diclofenac (VOLTAREN) 75 MG EC tablet TAKE 1 TABLET BY MOUTH TWICE A DAY 60 tablet 1   FLUoxetine (PROZAC) 40 MG capsule TAKE 1 CAPSULE (40 MG TOTAL) BY MOUTH DAILY. 90 capsule 3   nitroGLYCERIN (NITRODUR - DOSED IN MG/24 HR) 0.2 mg/hr patch Apply 1/4 patch daily to tendon for tendonitis. 30  patch 1   pantoprazole (PROTONIX) 40 MG tablet Take 1 tablet (40 mg total) by mouth 2 (two) times daily before a meal. 180 tablet 0   Semaglutide-Weight Management (WEGOVY) 0.5 MG/0.5ML SOAJ Inject 0.5 mg into the skin once a week. 2 mL 0   Semaglutide-Weight Management (WEGOVY) 1 MG/0.5ML SOAJ Inject 1 mg into the skin once a week. 2 mL 1   Semaglutide-Weight Management (WEGOVY) 1 MG/0.5ML SOAJ Inject 1 mg into the skin once a week. 2 mL 0   Semaglutide-Weight Management (WEGOVY) 1 MG/0.5ML SOAJ Inject 1 mg into the skin once a week. 2 mL 0   Semaglutide-Weight Management (WEGOVY) 1.7 MG/0.75ML SOAJ Inject 1.7 mg into the skin once a week. 3 mL 0   Semaglutide-Weight Management (WEGOVY) 1.7 MG/0.75ML SOAJ Inject 1.7 mg into the skin once a week. 3 mL 0   spironolactone (ALDACTONE) 25 MG tablet Take 1-2 tablets (25-50 mg total) by mouth daily. 60 tablet 0   No current facility-administered medications for this visit.    Medication Side Effects: {Medication Side Effects (Optional):21014029}  Allergies: No Known Allergies  Past Medical History:  Diagnosis Date   Anxiety    B12 deficiency    Back pain    Constipation    Depression    Endometriosis    GERD (gastroesophageal reflux disease)    tums prn   Gestational diabetes mellitus, antepartum    Joint pain    Knee pain    Low energy  Lower extremity edema    Seasonal allergies    SVD (spontaneous vaginal delivery)    x 2   Vitamin D deficiency     Past Medical History, Surgical history, Social history, and Family history were reviewed and updated as appropriate.   Please see review of systems for further details on the patient's review from today.   Objective:   Physical Exam:  There were no vitals taken for this visit.  Physical Exam  Lab Review:     Component Value Date/Time   NA 137 07/22/2021 0000   K 4.0 07/22/2021 0000   CL 100 07/22/2021 0000   CO2 21 07/22/2021 0000   GLUCOSE 80 07/25/2015 0745   BUN  11 07/22/2021 0000   CREATININE 1.0 07/22/2021 0000   CREATININE 0.65 07/25/2015 0745   CREATININE 1.15 (H) 02/15/2013 1100   CALCIUM 9.1 07/22/2021 0000   PROT 7.6 02/15/2013 1100   ALBUMIN 4.0 07/22/2021 0000   AST 26 07/22/2021 0000   ALT 28 02/15/2013 1100   ALKPHOS 124 07/22/2021 0000   BILITOT 0.4 02/15/2013 1100   GFRNONAA >60 07/25/2015 0745   GFRAA >60 07/25/2015 0745       Component Value Date/Time   WBC 9.3 07/22/2021 0000   WBC 9.8 07/26/2015 0543   RBC 4.18 07/22/2021 0000   HGB 12.7 07/22/2021 0000   HCT 38 07/22/2021 0000   PLT 323 07/22/2021 0000   MCV 96.2 07/26/2015 0543   MCV 96.1 02/15/2013 1102   MCH 31.7 07/26/2015 0543   MCHC 33.0 07/26/2015 0543   RDW 14.3 07/26/2015 0543    No results found for: "POCLITH", "LITHIUM"   No results found for: "PHENYTOIN", "PHENOBARB", "VALPROATE", "CBMZ"   .res Assessment: Plan:    Plan:  1. Continue Prozac 40mg  daily   RTC 1 year   Time spent with patient was 15 minutes. Greater than 50% of face to face time with patient was spent on counseling and coordination of care.    Patient advised to contact office with any questions, adverse effects, or acute worsening in signs and symptoms.  Discussed potential benefits, risk, and side effects of benzodiazepines to include potential risk of tolerance and dependence, as well as possible drowsiness.  Advised patient not to drive if experiencing drowsiness and to take lowest possible effective dose to minimize risk of dependence and tolerance.  Discussed potential benefits, risks, and side effects of stimulants with patient to include increased heart rate, palpitations, insomnia, increased anxiety, increased irritability, or decreased appetite.  Instructed patient to contact office if experiencing any significant tolerability issues.  There are no diagnoses linked to this encounter.   Please see After Visit Summary for patient specific instructions.  Future  Appointments  Date Time Provider Department Center  07/17/2023  4:20 PM Amandine Covino, Thereasa Solo, NP CP-CP None    No orders of the defined types were placed in this encounter.   -------------------------------

## 2023-07-17 NOTE — Progress Notes (Signed)
Patient no show appointment. ? ?

## 2023-08-14 ENCOUNTER — Telehealth (INDEPENDENT_AMBULATORY_CARE_PROVIDER_SITE_OTHER): Payer: BC Managed Care – PPO | Admitting: Adult Health

## 2023-08-14 ENCOUNTER — Encounter: Payer: Self-pay | Admitting: Adult Health

## 2023-08-14 DIAGNOSIS — F331 Major depressive disorder, recurrent, moderate: Secondary | ICD-10-CM

## 2023-08-14 DIAGNOSIS — F411 Generalized anxiety disorder: Secondary | ICD-10-CM | POA: Diagnosis not present

## 2023-08-14 DIAGNOSIS — K219 Gastro-esophageal reflux disease without esophagitis: Secondary | ICD-10-CM | POA: Diagnosis not present

## 2023-08-14 MED ORDER — FLUOXETINE HCL 40 MG PO CAPS: 40.0000 mg | ORAL_CAPSULE | Freq: Every day | ORAL | 3 refills | Status: DC

## 2023-08-14 MED ORDER — PANTOPRAZOLE SODIUM 40 MG PO TBEC: 40.0000 mg | DELAYED_RELEASE_TABLET | Freq: Two times a day (BID) | ORAL | 0 refills | Status: DC

## 2023-08-14 NOTE — Progress Notes (Signed)
Karen Norman [id number] [birth year] 42 y.o.  Virtual Visit via Video Note  I connected with pt @ on 08/14/23 at  3:00 PM EDT by a video enabled telemedicine application and verified that I am speaking with the correct person using two identifiers.   I discussed the limitations of evaluation and management by telemedicine and the availability of in person appointments. The patient expressed understanding and agreed to proceed.  I discussed the assessment and treatment plan with the patient. The patient was provided an opportunity to ask questions and all were answered. The patient agreed with the plan and demonstrated an understanding of the instructions.   The patient was advised to call back or seek an in-person evaluation if the symptoms worsen or if the condition fails to improve as anticipated.  I provided 10 minutes of non-face-to-face time during this encounter.  The patient was located at home.  The provider was located at [facility].   Dorothyann Gibbs, NP   Subjective:   Patient ID:  Karen Norman is a 42 y.o. (DOB 20-Nov-1980) female.  Chief Complaint: No chief complaint on file.   HPI Karen Norman presents for follow-up of MDD and GAD  Describes mood today as "ok". Pleasant. Mood symptoms - denies depression, anxiety, and irritability. Denies panic attacks. Denies worry, rumination and over thinking. Mood is consistent. Stating "I feel like I'm doing alright". Feels like Prozac continues to work well. Stable interest and motivation. Taking medications as prescribed.  Energy levels stable. Active, has a regular exercise routine.  Enjoys some usual interests and activities. Married. Lives with husband and 3 children - 2 boys and one girl - 46, 76, and 8. Spending time with family.  Appetite adequate. Weight loss. Sleeps well most nights. Averages 6 to 8 hours.  Focus and concentration stable. Completing tasks. Managing aspects of household. Works  full-time as a Human resources officer. Denies SI or HI.  Denies AH or VH. Denies self harm.  Denies substance use.   Review of Systems:  Review of Systems  Musculoskeletal:  Negative for gait problem.  Neurological:  Negative for tremors.  Psychiatric/Behavioral:         Please refer to HPI    Medications: I have reviewed the patient's current medications.  Current Outpatient Medications  Medication Sig Dispense Refill   cetirizine (ZYRTEC) 10 MG tablet Take 10 mg by mouth daily.     Cholecalciferol (D3 VITAMIN PO) Take by mouth.     cyanocobalamin (,VITAMIN B-12,) 1000 MCG/ML injection Inject into the muscle.     diclofenac (VOLTAREN) 75 MG EC tablet TAKE 1 TABLET BY MOUTH TWICE A DAY 60 tablet 1   FLUoxetine (PROZAC) 40 MG capsule TAKE 1 CAPSULE (40 MG TOTAL) BY MOUTH DAILY. 90 capsule 3   nitroGLYCERIN (NITRODUR - DOSED IN MG/24 HR) 0.2 mg/hr patch Apply 1/4 patch daily to tendon for tendonitis. 30 patch 1   pantoprazole (PROTONIX) 40 MG tablet Take 1 tablet (40 mg total) by mouth 2 (two) times daily before a meal. 180 tablet 0   Semaglutide-Weight Management (WEGOVY) 0.5 MG/0.5ML SOAJ Inject 0.5 mg into the skin once a week. 2 mL 0   Semaglutide-Weight Management (WEGOVY) 1 MG/0.5ML SOAJ Inject 1 mg into the skin once a week. 2 mL 1   Semaglutide-Weight Management (WEGOVY) 1 MG/0.5ML SOAJ Inject 1 mg into the skin once a week. 2 mL 0   Semaglutide-Weight Management (WEGOVY) 1 MG/0.5ML SOAJ Inject 1 mg into the skin once  a week. 2 mL 0   Semaglutide-Weight Management (WEGOVY) 1.7 MG/0.75ML SOAJ Inject 1.7 mg into the skin once a week. 3 mL 0   Semaglutide-Weight Management (WEGOVY) 1.7 MG/0.75ML SOAJ Inject 1.7 mg into the skin once a week. 3 mL 0   spironolactone (ALDACTONE) 25 MG tablet Take 1-2 tablets (25-50 mg total) by mouth daily. 60 tablet 0   No current facility-administered medications for this visit.    Medication Side Effects: None  Allergies: No Known  Allergies  Past Medical History:  Diagnosis Date   Anxiety    B12 deficiency    Back pain    Constipation    Depression    Endometriosis    GERD (gastroesophageal reflux disease)    tums prn   Gestational diabetes mellitus, antepartum    Joint pain    Knee pain    Low energy    Lower extremity edema    Seasonal allergies    SVD (spontaneous vaginal delivery)    x 2   Vitamin D deficiency     Family History  Problem Relation Age of Onset   Anxiety disorder Mother    Anxiety disorder Father    Hyperlipidemia Father    Hypertension Father    Heart disease Father    Depression Father    Drug abuse Father    Cancer Maternal Grandmother    Breast cancer Maternal Grandmother    Breast cancer Paternal Grandmother     Social History   Socioeconomic History   Marital status: Married    Spouse name: Not on file   Number of children: Not on file   Years of education: Not on file   Highest education level: Not on file  Occupational History   Occupation: Warehouse manager  Tobacco Use   Smoking status: Never   Smokeless tobacco: Never  Substance and Sexual Activity   Alcohol use: No   Drug use: No   Sexual activity: Yes    Birth control/protection: None    Comment: approx 11 wlks gestation as of 01/09/15  Other Topics Concern   Not on file  Social History Narrative   Not on file   Social Determinants of Health   Financial Resource Strain: Low Risk  (08/08/2022)   Received from [facility], [facility]   Overall Financial Resource Strain (CARDIA)    Difficulty of Paying Living Expenses: Not hard at all  Food Insecurity: No Food Insecurity (08/08/2022)   Received from [facility], [facility]   Hunger Vital Sign    Worried About Running Out of Food in the Last Year: Never true    Ran Out of Food in the Last Year: Never true  Transportation Needs: Not on file  Physical Activity: Sufficiently Active (08/08/2022)   Received from [facility],  [facility]   Exercise Vital Sign    Days of Exercise per Week: 5 days    Minutes of Exercise per Session: 30 min  Stress: No Stress Concern Present (08/08/2022)   Received from [facility], [facility] - Occupational Stress Questionnaire    Feeling of Stress : Not at all  Social Connections: Socially Integrated (08/08/2022)   Received from [facility], [facility]   Social Network    How would you rate your social network (family, work, friends)?: Good participation with social networks  Intimate Partner Violence: Not At Risk (08/08/2022)   Received from [facility], [facility]   HITS  Over the last 12 months how often did your partner physically hurt you?: 1    Over the last 12 months how often did your partner insult you or talk down to you?: 1    Over the last 12 months how often did your partner threaten you with physical harm?: 1    Over the last 12 months how often did your partner scream or curse at you?: 1    Past Medical History, Surgical history, Social history, and Family history were reviewed and updated as appropriate.   Please see review of systems for further details on the patient's review from today.   Objective:   Physical Exam:  There were no vitals taken for this visit.  Physical Exam Constitutional:      General: She is not in acute distress. Musculoskeletal:        General: No deformity.  Neurological:     Mental Status: She is alert and oriented to person, place, and time.     Coordination: Coordination normal.  Psychiatric:        Attention and Perception: Attention and perception normal. She does not perceive auditory or visual hallucinations.        Mood and Affect: Affect is not labile, blunt, angry or inappropriate.        Speech: Speech normal.        Behavior: Behavior normal.        Thought Content: Thought content normal. Thought content is not paranoid or delusional. Thought content  does not include homicidal or suicidal ideation. Thought content does not include homicidal or suicidal plan.        Cognition and Memory: Cognition and memory normal.        Judgment: Judgment normal.     Comments: Insight intact     Lab Review:     Component Value Date/Time   NA 137 07/22/2021 0000   K 4.0 07/22/2021 0000   CL 100 07/22/2021 0000   CO2 21 07/22/2021 0000   GLUCOSE 80 07/25/2015 0745   BUN 11 07/22/2021 0000   CREATININE 1.0 07/22/2021 0000   CREATININE 0.65 07/25/2015 0745   CREATININE 1.15 (H) 02/15/2013 1100   CALCIUM 9.1 07/22/2021 0000   PROT 7.6 02/15/2013 1100   ALBUMIN 4.0 07/22/2021 0000   AST 26 07/22/2021 0000   ALT 28 02/15/2013 1100   ALKPHOS 124 07/22/2021 0000   BILITOT 0.4 02/15/2013 1100   GFRNONAA >60 07/25/2015 0745   GFRAA >60 07/25/2015 0745       Component Value Date/Time   WBC 9.3 07/22/2021 0000   WBC 9.8 07/26/2015 0543   RBC 4.18 07/22/2021 0000   HGB 12.7 07/22/2021 0000   HCT 38 07/22/2021 0000   PLT 323 07/22/2021 0000   MCV 96.2 07/26/2015 0543   MCV 96.1 02/15/2013 1102   MCH 31.7 07/26/2015 0543   MCHC 33.0 07/26/2015 0543   RDW 14.3 07/26/2015 0543    No results found for: "POCLITH", "LITHIUM"   No results found for: "PHENYTOIN", "PHENOBARB", "VALPROATE", "CBMZ"   .res Assessment: Plan:    Plan:  Continue Prozac 40mg  daily   RTC 1 year  Time spent with patient was 10 minutes. Greater than 50% of face to face time with patient was spent on counseling and coordination of care.    Patient advised to contact office with any questions, adverse effects, or acute worsening in signs and symptoms.  Discussed potential benefits, risk, and side effects of benzodiazepines to include  potential risk of tolerance and dependence, as well as possible drowsiness.  Advised patient not to drive if experiencing drowsiness and to take lowest possible effective dose to minimize risk of dependence and tolerance.  Discussed  potential benefits, risks, and side effects of stimulants with patient to include increased heart rate, palpitations, insomnia, increased anxiety, increased irritability, or decreased appetite.  Instructed patient to contact office if experiencing any significant tolerability issues.  There are no diagnoses linked to this encounter.   Please see After Visit Summary for patient specific instructions.  Future Appointments  Date Time Provider Department Center  08/14/2023  3:00 PM Chelan Heringer, Thereasa Solo, NP [facility] None    No orders of the defined types were placed in this encounter.     -------------------------------

## 2023-11-18 ENCOUNTER — Other Ambulatory Visit: Payer: Self-pay | Admitting: Adult Health

## 2023-11-18 DIAGNOSIS — K219 Gastro-esophageal reflux disease without esophagitis: Secondary | ICD-10-CM

## 2024-02-13 IMAGING — MR MR ANKLE*R* W/O CM
5 series · 40 of 40 positions shown · non-contrast
Comparison: None.

CLINICAL DATA: Right ankle pain extending into the heel for 1 year.

EXAM:
MRI OF THE RIGHT ANKLE WITHOUT CONTRAST
TECHNIQUE: Multiplanar, multisequence MR imaging of the ankle was performed. No
intravenous contrast was administered.

[Series 4: T2 fat-sat · axial · 3.0mm · 0.62mm/px · z∈[-59,+96]mm · 10 of 41 slices shown (1 of 2)]
[im 1/41]
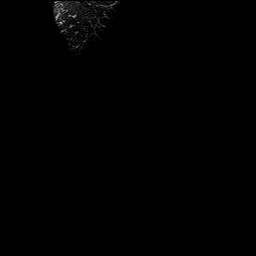
[im 5/41]
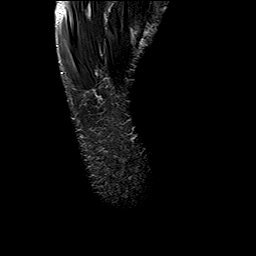
[im 9/41]
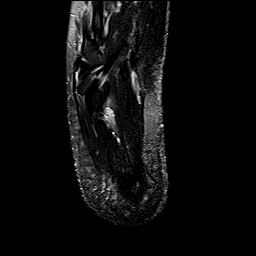
[im 14/41]
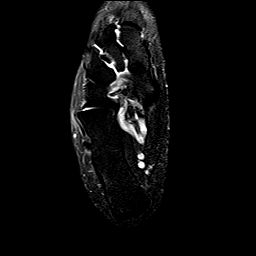
[im 18/41]
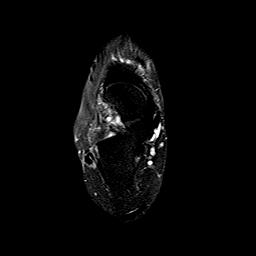
[im 23/41]
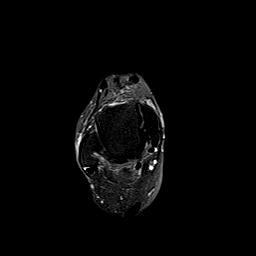
[im 27/41]
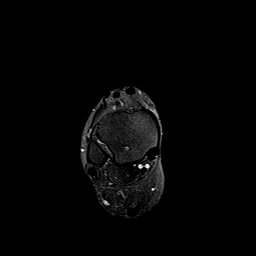
[im 32/41]
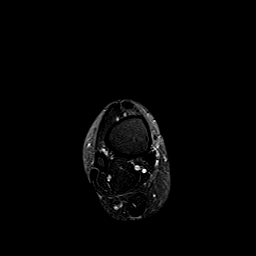
[im 36/41]
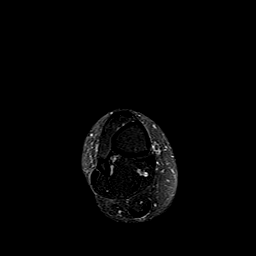
[im 41/41]
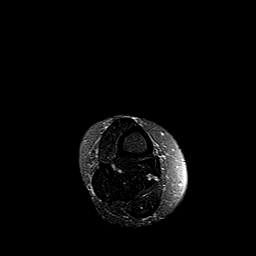

[Series 5: T1 · axial · 3.0mm · 0.62mm/px · z∈[-58,+97]mm · 10 of 41 slices shown (1 of 2)]
[im 1/41]
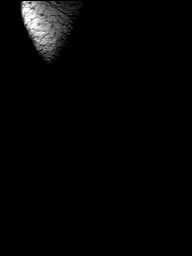
[im 5/41]
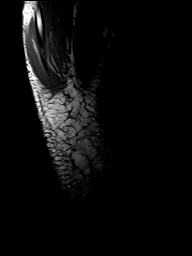
[im 9/41]
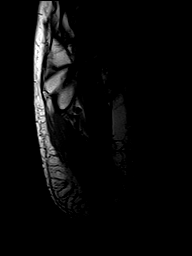
[im 14/41]
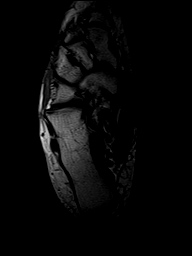
[im 18/41]
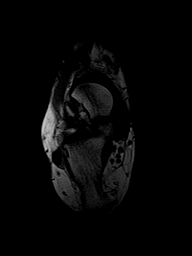
[im 23/41]
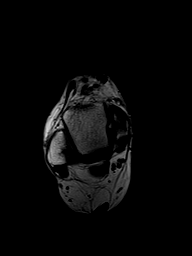
[im 27/41]
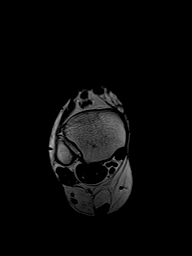
[im 32/41]
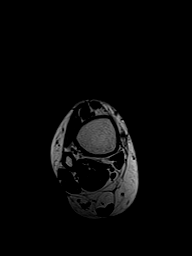
[im 36/41]
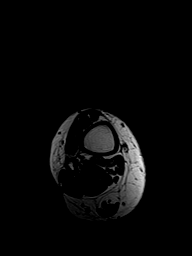
[im 41/41]
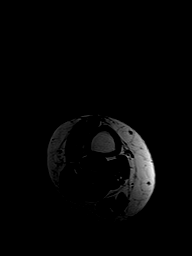

[Series 6: T1 · sagittal · 4.0mm · 0.70mm/px · 5 of 22 slices shown (2 of 2)]
[im 1/22]
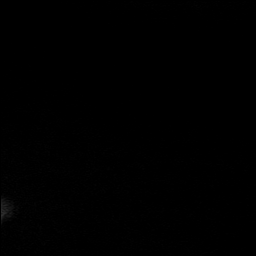
[im 6/22]
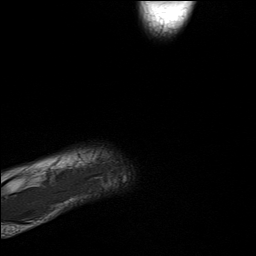
[im 11/22]
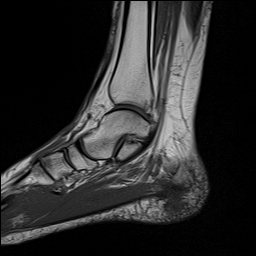
[im 16/22]
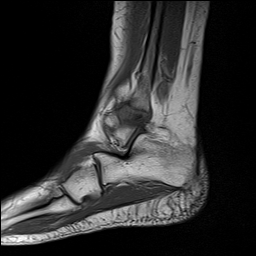
[im 22/22]
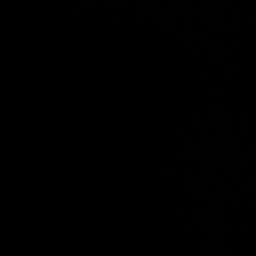

[Series 7: STIR · sagittal · 4.0mm · 0.35mm/px · 5 of 22 slices shown]
[im 1/22]
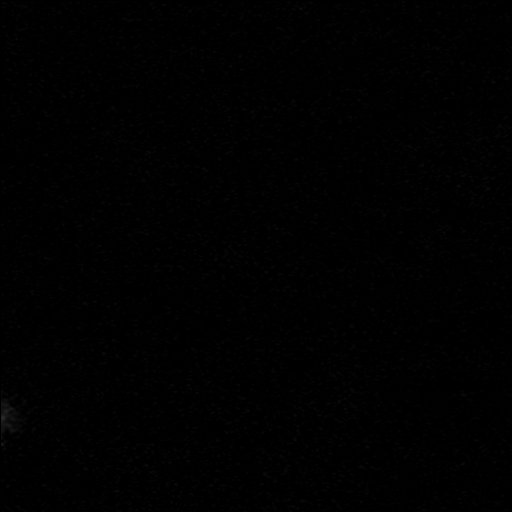
[im 6/22]
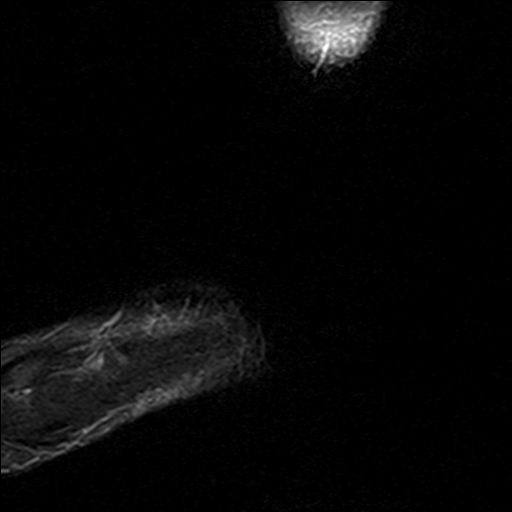
[im 11/22]
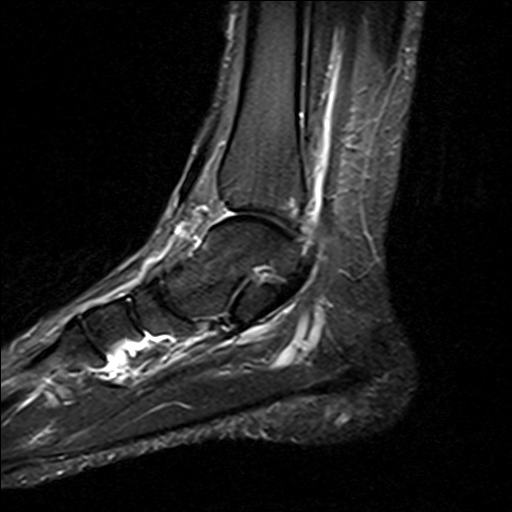
[im 16/22]
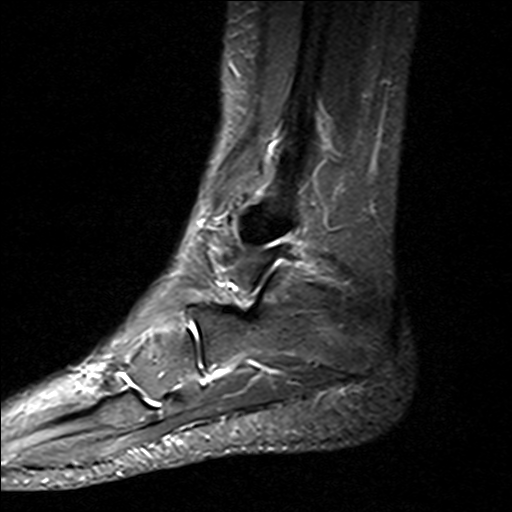
[im 22/22]
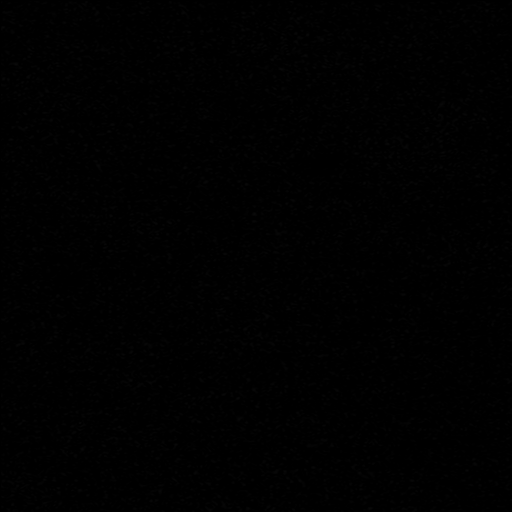

[Series 8: T2 fat-sat · coronal · 3.0mm · 0.62mm/px · 10 of 41 slices shown (2 of 2)]
[im 1/41]
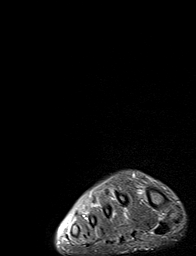
[im 5/41]
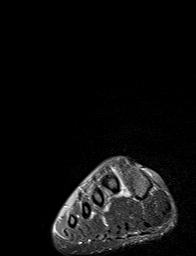
[im 9/41]
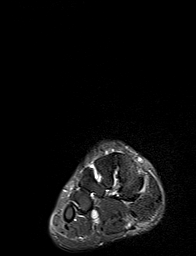
[im 14/41]
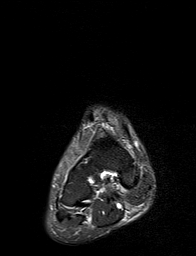
[im 18/41]
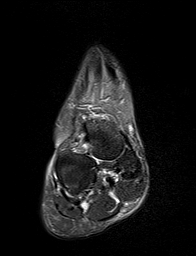
[im 23/41]
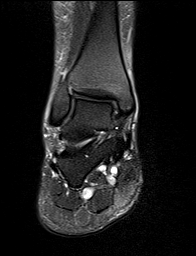
[im 27/41]
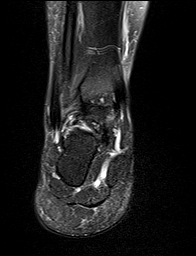
[im 32/41]
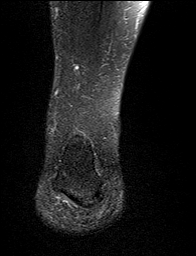
[im 36/41]
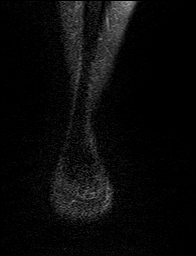
[im 41/41]
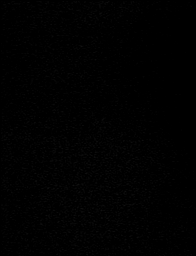

[40 of 40 positions shown; findings below may reference images not displayed]

FINDINGS: TENDONS

Peroneal: Peroneal longus tendon intact. Peroneal brevis intact.
Mild peroneal tenosynovitis.

Posteromedial: Posterior tibial tendon intact. Flexor hallucis
longus tendon intact. Flexor digitorum longus tendon intact. Small
amount of fluid in the posterior tibial tendon sheath and flexor
digitorum tendon sheath as can be seen with tenosynovitis.

Anterior: Tibialis anterior tendon intact. Extensor hallucis longus
tendon intact Extensor digitorum longus tendon intact.

Achilles:  Intact.

Plantar Fascia: Mild thickening of the medial band of the plantar
fascia at the calcaneal insertion concerning for mild plantar
fasciitis. No tear of the plantar fascia.

LIGAMENTS

Lateral: Anterior talofibular ligament intact. Calcaneofibular
ligament intact. Posterior talofibular ligament intact. Anterior and
posterior tibiofibular ligaments intact.

Medial: Deltoid ligament intact. Spring ligament intact.

CARTILAGE

Ankle Joint: No joint effusion. Partial-thickness cartilage loss of
the posterior tibial plafond with subchondral reactive marrow
changes. Small os trigonum with marrow edema and cystic changes as
can be seen with os trigonum syndrome.

Subtalar Joints/Sinus Tarsi: Normal subtalar joints. No subtalar
joint effusion. Normal sinus tarsi.

Bones: No aggressive osseous lesion.  No fracture or dislocation.

Soft Tissue: No fluid collection or hematoma. Muscles are normal
without edema or atrophy. Tarsal tunnel is normal.
IMPRESSION: 1. Mild plantar fasciitis of the medial band of the plantar fascia
at the calcaneal insertion without a tear.
2. Small os trigonum with marrow edema and cystic changes as can be
seen with os trigonum syndrome.
3. Mild peroneal tenosynovitis.
4. Mild posterior tibial and flexor digitorum tenosynovitis.

## 2024-02-25 ENCOUNTER — Other Ambulatory Visit: Payer: Self-pay | Admitting: Adult Health

## 2024-02-25 DIAGNOSIS — K219 Gastro-esophageal reflux disease without esophagitis: Secondary | ICD-10-CM

## 2024-09-17 ENCOUNTER — Other Ambulatory Visit: Payer: Self-pay | Admitting: Adult Health

## 2024-09-17 DIAGNOSIS — F411 Generalized anxiety disorder: Secondary | ICD-10-CM

## 2024-09-17 DIAGNOSIS — F331 Major depressive disorder, recurrent, moderate: Secondary | ICD-10-CM

## 2024-09-18 NOTE — Telephone Encounter (Signed)
 Please call to schedule FU, was due in Oct.

## 2024-09-19 NOTE — Telephone Encounter (Signed)
 Lvm for pt to call and schedule

## 2024-09-20 ENCOUNTER — Encounter: Payer: Self-pay | Admitting: Adult Health

## 2024-09-20 ENCOUNTER — Telehealth: Admitting: Adult Health

## 2024-09-20 DIAGNOSIS — F411 Generalized anxiety disorder: Secondary | ICD-10-CM | POA: Diagnosis not present

## 2024-09-20 DIAGNOSIS — F331 Major depressive disorder, recurrent, moderate: Secondary | ICD-10-CM | POA: Diagnosis not present

## 2024-09-20 MED ORDER — FLUOXETINE HCL 20 MG PO CAPS: 20.0000 mg | ORAL_CAPSULE | Freq: Every day | ORAL | 3 refills | Status: AC

## 2024-09-20 MED ORDER — FLUOXETINE HCL 40 MG PO CAPS: 40.0000 mg | ORAL_CAPSULE | Freq: Every day | ORAL | 3 refills | Status: AC

## 2024-09-20 NOTE — Progress Notes (Signed)
 Karen Norman [id number] 15-Dec-1980 43 y.o.  Virtual Visit via Video Note  I connected with pt @ on 09/20/24 at  4:30 PM EST by a video enabled telemedicine application and verified that I am speaking with the correct person using two identifiers.   I discussed the limitations of evaluation and management by telemedicine and the availability of in person appointments. The patient expressed understanding and agreed to proceed.  I discussed the assessment and treatment plan with the patient. The patient was provided an opportunity to ask questions and all were answered. The patient agreed with the plan and demonstrated an understanding of the instructions.   The patient was advised to call back or seek an in-person evaluation if the symptoms worsen or if the condition fails to improve as anticipated.  I provided 10 minutes of non-face-to-face time during this encounter.  The patient was located at home.  The provider was located at [facility].   Angeline LOISE Sayers, NP   Subjective:   Patient ID:  Karen Norman is a 43 y.o. (DOB [birth year]) female.  Chief Complaint: No chief complaint on file.   HPI Karen Norman presents for follow-up of MDD and GAD  Describes mood today as ok. Pleasant. Mood symptoms - reports   anxiety and irritability - situational. Denies depression. Reports stable interest and motivation. Denies panic attacks. Reports some worry. Denies rumination and over thinking. Reports mood is stable. Stating I feel like I'm doing ok. Feels like Prozac  continues to work well, but would to increase dose with current situational stressors. Taking medications as prescribed.  Energy levels stable. Active, has a regular exercise routine.  Enjoys some usual interests and activities. Married. Lives with husband and 3 children. Spending time with family.  Appetite adequate. Weight loss. Sleeps well most nights. Averages 6 to 8 hours.  Focus and concentration  stable. Completing tasks. Managing aspects of household. 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 Denies SI or HI.  Denies AH or VH. Denies self harm.  Denies substance use.    Review of Systems:  Review of Systems  Musculoskeletal:  Negative for gait problem.  Neurological:  Negative for tremors.  Psychiatric/Behavioral:         Please refer to HPI    Medications: I have reviewed the patient's current medications.  Current Outpatient Medications  Medication Sig Dispense Refill   cetirizine (ZYRTEC) 10 MG tablet Take 10 mg by mouth daily.     Cholecalciferol (D3 VITAMIN PO) Take by mouth.     cyanocobalamin (,VITAMIN B-12,) 1000 MCG/ML injection Inject into the muscle.     diclofenac  (VOLTAREN ) 75 MG EC tablet TAKE 1 TABLET BY MOUTH TWICE A DAY 60 tablet 1   FLUoxetine  (PROZAC ) 40 MG capsule Take 1 capsule (40 mg total) by mouth daily. 90 capsule 3   nitroGLYCERIN  (NITRODUR - DOSED IN MG/24 HR) 0.2 mg/hr patch Apply 1/4 patch daily to tendon for tendonitis. 30 patch 1   pantoprazole  (PROTONIX ) 40 MG tablet TAKE 1 TABLET (40 MG TOTAL) BY MOUTH TWICE A DAY BEFORE MEALS 180 tablet 0   Semaglutide -Weight Management (WEGOVY ) 0.5 MG/0.5ML SOAJ Inject 0.5 mg into the skin once a week. 2 mL 0   Semaglutide -Weight Management (WEGOVY ) 1 MG/0.5ML SOAJ Inject 1 mg into the skin once a week. 2 mL 1   Semaglutide -Weight Management (WEGOVY ) 1 MG/0.5ML SOAJ Inject 1 mg into the skin once a week. 2 mL 0  Semaglutide -Weight Management (WEGOVY ) 1 MG/0.5ML SOAJ Inject 1 mg into the skin once a week. 2 mL 0   Semaglutide -Weight Management (WEGOVY ) 1.7 MG/0.75ML SOAJ Inject 1.7 mg into the skin once a week. 3 mL 0   Semaglutide -Weight Management (WEGOVY ) 1.7 MG/0.75ML SOAJ Inject 1.7 mg into the skin once a week. 3 mL 0   spironolactone  (ALDACTONE ) 25 MG tablet Take 1-2 tablets (25-50 mg total) by mouth daily. 60 tablet 0   No current  facility-administered medications for this visit.    Medication Side Effects: None  Allergies: No Known Allergies  Past Medical History:  Diagnosis Date   Anxiety    B12 deficiency    Back pain    Constipation    Depression    Endometriosis    GERD (gastroesophageal reflux disease)    tums prn   Gestational diabetes mellitus, antepartum    Joint pain    Knee pain    Low energy    Lower extremity edema    Seasonal allergies    SVD (spontaneous vaginal delivery)    x 2   Vitamin D deficiency     Family History  Problem Relation Age of Onset   Anxiety disorder Mother    Anxiety disorder Father    Hyperlipidemia Father    Hypertension Father    Heart disease Father    Depression Father    Drug abuse Father    Cancer Maternal Grandmother    Breast cancer Maternal Grandmother    Breast cancer Paternal Grandmother     Social History   Socioeconomic History   Marital status: Married    Spouse name: Not on file   Number of children: Not on file   Years of education: Not on file   Highest education level: Not on file  Occupational History   Occupation: Warehouse Manager  Tobacco Use   Smoking status: Never   Smokeless tobacco: Never  Substance and Sexual Activity   Alcohol use: No   Drug use: No   Sexual activity: Yes    Birth control/protection: None    Comment: approx 11 wlks gestation as of 01/09/15  Other Topics Concern   Not on file  Social History Narrative   Not on file   Social Drivers of Health   Financial Resource Strain: Low Risk  (08/08/2022)   Received from [facility]   Overall Financial Resource Strain (CARDIA)    Difficulty of Paying Living Expenses: Not hard at all  Food Insecurity: No Food Insecurity (08/08/2022)   Received from [facility]   Hunger Vital Sign    Within the past 12 months, you worried that your food would run out before you got the money to buy more.: Never true    Within the past 12 months, the food you  bought just didn't last and you didn't have money to get more.: Never true  Transportation Needs: Not on file  Physical Activity: Sufficiently Active (08/08/2022)   Received from [facility]   Exercise Vital Sign    On average, how many days per week do you engage in moderate to strenuous exercise (like a brisk walk)?: 5 days    On average, how many minutes do you engage in exercise at this level?: 30 min  Stress: No Stress Concern Present (08/08/2022)   Received from [facility] of [facility] - Occupational Stress Questionnaire    Feeling of Stress : Not at all  Social Connections: Unknown (08/22/2023)  Received from [facility]   Social Network    Social Network: Not on file  Intimate Partner Violence: Unknown (08/22/2023)   Received from [facility]   HITS    Physically Hurt: Not on file    Insult or Talk Down To: Not on file    Threaten Physical Harm: Not on file    Scream or Curse: Not on file    Past Medical History, Surgical history, Social history, and Family history were reviewed and updated as appropriate.   Please see review of systems for further details on the patient's review from today.   Objective:   Physical Exam:  There were no vitals taken for this visit.  Physical Exam Constitutional:      General: She is not in acute distress. Musculoskeletal:        General: No deformity.  Neurological:     Mental Status: She is alert and oriented to person, place, and time.     Coordination: Coordination normal.  Psychiatric:        Attention and Perception: Attention and perception normal. She does not perceive auditory or visual hallucinations.        Mood and Affect: Mood normal. Mood is not anxious or depressed. Affect is not labile, blunt, angry or inappropriate.        Speech: Speech normal.        Behavior: Behavior normal.        Thought Content: Thought content normal. Thought content is not paranoid or delusional.  Thought content does not include homicidal or suicidal ideation. Thought content does not include homicidal or suicidal plan.        Cognition and Memory: Cognition and memory normal.        Judgment: Judgment normal.     Comments: Insight intact     Lab Review:     Component Value Date/Time   NA 137 07/22/2021 0000   K 4.0 07/22/2021 0000   CL 100 07/22/2021 0000   CO2 21 07/22/2021 0000   GLUCOSE 80 07/25/2015 0745   BUN 11 07/22/2021 0000   CREATININE 1.0 07/22/2021 0000   CREATININE 0.65 07/25/2015 0745   CREATININE 1.15 (H) 02/15/2013 1100   CALCIUM 9.1 07/22/2021 0000   PROT 7.6 02/15/2013 1100   ALBUMIN 4.0 07/22/2021 0000   AST 26 07/22/2021 0000   ALT 28 02/15/2013 1100   ALKPHOS 124 07/22/2021 0000   BILITOT 0.4 02/15/2013 1100   GFRNONAA >60 07/25/2015 0745   GFRAA >60 07/25/2015 0745       Component Value Date/Time   WBC 9.3 07/22/2021 0000   WBC 9.8 07/26/2015 0543   RBC 4.18 07/22/2021 0000   HGB 12.7 07/22/2021 0000   HCT 38 07/22/2021 0000   PLT 323 07/22/2021 0000   MCV 96.2 07/26/2015 0543   MCV 96.1 02/15/2013 1102   MCH 31.7 07/26/2015 0543   MCHC 33.0 07/26/2015 0543   RDW 14.3 07/26/2015 0543    No results found for: POCLITH, LITHIUM   No results found for: PHENYTOIN, PHENOBARB, VALPROATE, CBMZ   .res Assessment: Plan:    Plan:  Continue Prozac  40mg  daily   RTC 1 year  15 minutes spent dedicated to the care of this patient on the date of this encounter to include pre-visit review of records, ordering of medication, post visit documentation, and face-to-face time with the patient discussing GAD and MDD. Discussed continuing current medication regimen.  Patient advised to contact office with any questions, adverse effects,  or acute worsening in signs and symptoms.  Discussed potential benefits, risk, and side effects of benzodiazepines to include potential risk of tolerance and dependence, as well as possible drowsiness.   Advised patient not to drive if experiencing drowsiness and to take lowest possible effective dose to minimize risk of dependence and tolerance.  Discussed potential benefits, risks, and side effects of stimulants with patient to include increased heart rate, palpitations, insomnia, increased anxiety, increased irritability, or decreased appetite.  Instructed patient to contact office if experiencing any significant tolerability issues.  There are no diagnoses linked to this encounter.   Please see After Visit Summary for patient specific instructions.  Future Appointments  Date Time Provider Department Center  09/20/2024  4:30 PM Aryanna Shaver, Angeline Mattocks, NP [facility] None  10/21/2024  8:20 AM Dolphus Reiter, MD [facility] None  11/22/2024 11:20 AM Dolphus Reiter, MD [facility] None    No orders of the defined types were placed in this encounter.     -------------------------------

## 2024-09-21 NOTE — Telephone Encounter (Signed)
 Pt was seen yesterday.

## 2024-10-11 NOTE — Progress Notes (Deleted)
 "  Office Visit Note  Patient: Karen Norman             Date of Birth: [birth year]           MRN: [id number]             PCP: Godfrey Eck, MD Referring: Nicholaus Hurl, FNP Visit Date: 10/21/2024 Occupation: Data Unavailable  Subjective:  No chief complaint on file.   History of Present Illness: Karen Norman is a 43 y.o. female ***     Activities of Daily Living:  Patient reports morning stiffness for *** {minute/hour:19697}.   Patient {ACTIONS;DENIES/REPORTS:21021675::Denies} nocturnal pain.  Difficulty dressing/grooming: {ACTIONS;DENIES/REPORTS:21021675::Denies} Difficulty climbing stairs: {ACTIONS;DENIES/REPORTS:21021675::Denies} Difficulty getting out of chair: {ACTIONS;DENIES/REPORTS:21021675::Denies} Difficulty using hands for taps, buttons, cutlery, and/or writing: {ACTIONS;DENIES/REPORTS:21021675::Denies}  No Rheumatology ROS completed.   PMFS History:  Patient Active Problem List   Diagnosis Date Noted   BMI 34.0-34.9,adult 02/06/2022   ANA positive 08/02/2020   Chronic urticaria 08/02/2020   History of endometriosis 12/28/2018   History of gestational diabetes 01/15/2017   Pain in pelvis 10/14/2016   Indication for care in labor and delivery, antepartum 07/25/2015   NSVD (normal spontaneous vaginal delivery) 07/25/2015    Past Medical History:  Diagnosis Date   Anxiety    B12 deficiency    Back pain    Constipation    Depression    Endometriosis    GERD (gastroesophageal reflux disease)    tums prn   Gestational diabetes mellitus, antepartum    Joint pain    Knee pain    Low energy    Lower extremity edema    Seasonal allergies    SVD (spontaneous vaginal delivery)    x 2   Vitamin D deficiency     Family History  Problem Relation Age of Onset   Anxiety disorder Mother    Anxiety disorder Father    Hyperlipidemia Father    Hypertension Father    Heart disease Father    Depression Father    Drug abuse Father    Cancer  Maternal Grandmother    Breast cancer Maternal Grandmother    Breast cancer Paternal Grandmother    Past Surgical History:  Procedure Laterality Date   CERVICAL CERCLAGE N/A 01/26/2015   Procedure: CERCLAGE CERVICAL;  Surgeon: Nathanel Bunker, MD;  Location: [facility];  Service: Gynecology;  Laterality: N/A;   KNEE SURGERY     left   LAPAROTOMY     left ovary   WISDOM TOOTH EXTRACTION     Social History   Tobacco Use   Smoking status: Never   Smokeless tobacco: Never  Substance Use Topics   Alcohol use: No   Drug use: No   Social History   Social History Narrative   Not on file     There is no immunization history for the selected administration types on file for this patient.   Objective: Vital Signs: There were no vitals taken for this visit.   Physical Exam   Musculoskeletal Exam: ***  CDAI Exam: CDAI Score: -- Patient Global: --; Provider Global: -- Swollen: --; Tender: -- Joint Exam 10/21/2024   No joint exam has been documented for this visit   There is currently no information documented on the homunculus. Go to the Rheumatology activity and complete the homunculus joint exam.  Investigation: No additional findings.  Imaging: No results found.  Recent Labs: Lab Results  Component Value Date   WBC 9.3 07/22/2021   HGB 12.7 07/22/2021  PLT 323 07/22/2021   NA 137 07/22/2021   K 4.0 07/22/2021   CL 100 07/22/2021   CO2 21 07/22/2021   GLUCOSE 80 07/25/2015   BUN 11 07/22/2021   CREATININE 1.0 07/22/2021   BILITOT 0.4 02/15/2013   ALKPHOS 124 07/22/2021   AST 26 07/22/2021   ALT 28 02/15/2013   PROT 7.6 02/15/2013   ALBUMIN 4.0 07/22/2021   CALCIUM 9.1 07/22/2021   GFRAA >60 07/25/2015   February 02, 2023 CBC normal, CMP normal except creatinine 1.07, ALT 40, EBV IgG positive, IgM negative, TSH normal, testosterone normal, vitamin D 100, EBV IgG positive, sed rate 26, TPO antibody negative, B12 normal, ANA 1: 160 NS Speciality Comments: No  specialty comments available.  Procedures:  No procedures performed Allergies: Patient has no known allergies.   Assessment / Plan:     Visit Diagnoses: No diagnosis found.  Orders: No orders of the defined types were placed in this encounter.  No orders of the defined types were placed in this encounter.   Face-to-face time spent with patient was *** minutes. Greater than 50% of time was spent in counseling and coordination of care.  Follow-Up Instructions: No follow-ups on file.   Maya Nash, MD  Note - This record has been created using Animal nutritionist.  Chart creation errors have been sought, but may not always  have been located. Such creation errors do not reflect on  the standard of medical care. "

## 2024-10-21 ENCOUNTER — Encounter: Payer: Self-pay | Admitting: Rheumatology

## 2024-10-21 DIAGNOSIS — R7689 Other specified abnormal immunological findings in serum: Secondary | ICD-10-CM

## 2024-10-21 DIAGNOSIS — R5383 Other fatigue: Secondary | ICD-10-CM

## 2024-10-21 DIAGNOSIS — Z8742 Personal history of other diseases of the female genital tract: Secondary | ICD-10-CM

## 2024-10-21 DIAGNOSIS — L508 Other urticaria: Secondary | ICD-10-CM

## 2024-10-21 DIAGNOSIS — Z6834 Body mass index (BMI) 34.0-34.9, adult: Secondary | ICD-10-CM

## 2024-11-22 ENCOUNTER — Ambulatory Visit: Payer: Self-pay | Admitting: Rheumatology
